# Patient Record
Sex: Female | Born: 1940 | Race: White | Hispanic: No | Marital: Married | Smoking: Current every day smoker
Health system: Southern US, Community
[De-identification: ages and names within clinical notes are randomized; demographics above are authoritative.]

## PROBLEM LIST (undated history)

## (undated) DIAGNOSIS — F329 Major depressive disorder, single episode, unspecified: Secondary | ICD-10-CM

## (undated) DIAGNOSIS — R053 Chronic cough: Secondary | ICD-10-CM

## (undated) DIAGNOSIS — R05 Cough: Secondary | ICD-10-CM

## (undated) DIAGNOSIS — K219 Gastro-esophageal reflux disease without esophagitis: Secondary | ICD-10-CM

## (undated) DIAGNOSIS — F419 Anxiety disorder, unspecified: Secondary | ICD-10-CM

## (undated) DIAGNOSIS — E7800 Pure hypercholesterolemia, unspecified: Secondary | ICD-10-CM

## (undated) DIAGNOSIS — I10 Essential (primary) hypertension: Secondary | ICD-10-CM

## (undated) DIAGNOSIS — F32A Depression, unspecified: Secondary | ICD-10-CM

## (undated) HISTORY — PX: COLONOSCOPY: SHX174

## (undated) HISTORY — PX: BREAST BIOPSY: SHX20

## (undated) HISTORY — PX: BREAST EXCISIONAL BIOPSY: SUR124

---

## 1971-10-12 HISTORY — PX: VARICOSE VEIN SURGERY: SHX832

## 2007-01-19 ENCOUNTER — Ambulatory Visit: Payer: Self-pay | Admitting: Family Medicine

## 2007-03-03 ENCOUNTER — Encounter: Payer: Self-pay | Admitting: Family Medicine

## 2007-03-12 ENCOUNTER — Encounter: Payer: Self-pay | Admitting: Family Medicine

## 2007-04-11 ENCOUNTER — Encounter: Payer: Self-pay | Admitting: Family Medicine

## 2007-05-12 ENCOUNTER — Encounter: Payer: Self-pay | Admitting: Family Medicine

## 2007-06-12 ENCOUNTER — Encounter: Payer: Self-pay | Admitting: Family Medicine

## 2008-04-08 ENCOUNTER — Ambulatory Visit: Payer: Self-pay | Admitting: Family Medicine

## 2008-09-23 ENCOUNTER — Ambulatory Visit: Payer: Self-pay | Admitting: Family Medicine

## 2009-08-25 ENCOUNTER — Ambulatory Visit: Payer: Self-pay

## 2009-10-11 HISTORY — PX: REPLACEMENT TOTAL KNEE: SUR1224

## 2010-07-24 ENCOUNTER — Encounter: Payer: Self-pay | Admitting: Family Medicine

## 2010-08-11 ENCOUNTER — Encounter: Payer: Self-pay | Admitting: Family Medicine

## 2011-02-03 ENCOUNTER — Ambulatory Visit: Payer: Self-pay | Admitting: Family Medicine

## 2011-06-01 ENCOUNTER — Ambulatory Visit: Payer: Self-pay | Admitting: Unknown Physician Specialty

## 2012-02-09 ENCOUNTER — Ambulatory Visit: Payer: Self-pay | Admitting: Family Medicine

## 2012-08-01 ENCOUNTER — Ambulatory Visit: Payer: Self-pay | Admitting: Family Medicine

## 2013-05-29 ENCOUNTER — Ambulatory Visit: Payer: Self-pay | Admitting: Family Medicine

## 2014-06-04 ENCOUNTER — Ambulatory Visit: Payer: Self-pay | Admitting: Family Medicine

## 2014-08-30 ENCOUNTER — Ambulatory Visit: Payer: Self-pay | Admitting: Unknown Physician Specialty

## 2014-12-04 ENCOUNTER — Ambulatory Visit: Payer: Self-pay | Admitting: Unknown Physician Specialty

## 2015-02-03 LAB — SURGICAL PATHOLOGY

## 2015-03-06 ENCOUNTER — Other Ambulatory Visit: Payer: Self-pay | Admitting: Family Medicine

## 2015-03-06 DIAGNOSIS — R059 Cough, unspecified: Secondary | ICD-10-CM

## 2015-03-06 DIAGNOSIS — R05 Cough: Secondary | ICD-10-CM

## 2015-03-18 ENCOUNTER — Ambulatory Visit: Payer: Self-pay

## 2016-02-12 ENCOUNTER — Other Ambulatory Visit: Payer: Self-pay | Admitting: Family Medicine

## 2016-02-12 DIAGNOSIS — Z1231 Encounter for screening mammogram for malignant neoplasm of breast: Secondary | ICD-10-CM

## 2016-02-23 ENCOUNTER — Ambulatory Visit
Admission: RE | Admit: 2016-02-23 | Discharge: 2016-02-23 | Disposition: A | Discharge status: Home / Self Care | Payer: Medicare Other | Source: Ambulatory Visit | Attending: Family Medicine | Admitting: Family Medicine

## 2016-02-23 ENCOUNTER — Other Ambulatory Visit: Payer: Self-pay | Admitting: Family Medicine

## 2016-02-23 DIAGNOSIS — Z1231 Encounter for screening mammogram for malignant neoplasm of breast: Secondary | ICD-10-CM | POA: Insufficient documentation

## 2016-05-31 ENCOUNTER — Encounter: Payer: Self-pay | Admitting: Emergency Medicine

## 2016-05-31 ENCOUNTER — Observation Stay
Admission: EM | Admit: 2016-05-31 | Discharge: 2016-06-01 | Disposition: A | Discharge status: Home / Self Care | Payer: Medicare Other | Attending: Internal Medicine | Admitting: Internal Medicine

## 2016-05-31 ENCOUNTER — Emergency Department: Payer: Medicare Other

## 2016-05-31 DIAGNOSIS — F17200 Nicotine dependence, unspecified, uncomplicated: Secondary | ICD-10-CM | POA: Diagnosis not present

## 2016-05-31 DIAGNOSIS — E785 Hyperlipidemia, unspecified: Secondary | ICD-10-CM | POA: Diagnosis not present

## 2016-05-31 DIAGNOSIS — I6523 Occlusion and stenosis of bilateral carotid arteries: Secondary | ICD-10-CM | POA: Diagnosis not present

## 2016-05-31 DIAGNOSIS — Z881 Allergy status to other antibiotic agents status: Secondary | ICD-10-CM | POA: Insufficient documentation

## 2016-05-31 DIAGNOSIS — I083 Combined rheumatic disorders of mitral, aortic and tricuspid valves: Secondary | ICD-10-CM | POA: Insufficient documentation

## 2016-05-31 DIAGNOSIS — Z716 Tobacco abuse counseling: Secondary | ICD-10-CM

## 2016-05-31 DIAGNOSIS — Z8041 Family history of malignant neoplasm of ovary: Secondary | ICD-10-CM | POA: Insufficient documentation

## 2016-05-31 DIAGNOSIS — I739 Peripheral vascular disease, unspecified: Secondary | ICD-10-CM | POA: Insufficient documentation

## 2016-05-31 DIAGNOSIS — I10 Essential (primary) hypertension: Secondary | ICD-10-CM | POA: Insufficient documentation

## 2016-05-31 DIAGNOSIS — G459 Transient cerebral ischemic attack, unspecified: Principal | ICD-10-CM | POA: Insufficient documentation

## 2016-05-31 DIAGNOSIS — Z800 Family history of malignant neoplasm of digestive organs: Secondary | ICD-10-CM | POA: Diagnosis not present

## 2016-05-31 DIAGNOSIS — I639 Cerebral infarction, unspecified: Secondary | ICD-10-CM | POA: Diagnosis present

## 2016-05-31 DIAGNOSIS — I371 Nonrheumatic pulmonary valve insufficiency: Secondary | ICD-10-CM | POA: Diagnosis not present

## 2016-05-31 DIAGNOSIS — Z79899 Other long term (current) drug therapy: Secondary | ICD-10-CM | POA: Insufficient documentation

## 2016-05-31 HISTORY — DX: Essential (primary) hypertension: I10

## 2016-05-31 LAB — COMPREHENSIVE METABOLIC PANEL
ALK PHOS: 70 U/L (ref 38–126)
ALT: 16 U/L (ref 14–54)
AST: 23 U/L (ref 15–41)
Albumin: 4.1 g/dL (ref 3.5–5.0)
Anion gap: 8 (ref 5–15)
BUN: 14 mg/dL (ref 6–20)
CHLORIDE: 105 mmol/L (ref 101–111)
CO2: 26 mmol/L (ref 22–32)
Calcium: 9.1 mg/dL (ref 8.9–10.3)
Creatinine, Ser: 0.73 mg/dL (ref 0.44–1.00)
GFR calc non Af Amer: >60 mL/min (ref >60)
Glucose, Bld: 98 mg/dL (ref 65–99)
POTASSIUM: 3.5 mmol/L (ref 3.5–5.1)
SODIUM: 139 mmol/L (ref 135–145)
Total Bilirubin: 0.6 mg/dL (ref 0.3–1.2)
Total Protein: 7.6 g/dL (ref 6.5–8.1)

## 2016-05-31 LAB — DIFFERENTIAL
Basophils Absolute: 0.1 10*3/uL (ref 0–0.1)
Basophils Relative: 1 %
Eosinophils Absolute: 0.1 10*3/uL (ref 0–0.7)
Eosinophils Relative: 2 %
LYMPHS PCT: 44 %
Lymphs Abs: 3.3 10*3/uL (ref 1.0–3.6)
MONO ABS: 0.6 10*3/uL (ref 0.2–0.9)
MONOS PCT: 8 %
NEUTROS ABS: 3.5 10*3/uL (ref 1.4–6.5)
Neutrophils Relative %: 45 %

## 2016-05-31 LAB — LIPID PANEL
CHOLESTEROL: 263 mg/dL — AB (ref 0–200)
HDL: 64 mg/dL (ref >40)
LDL CALC: 178 mg/dL — AB (ref 0–99)
TRIGLYCERIDES: 105 mg/dL (ref <150)
Total CHOL/HDL Ratio: 4.1 RATIO
VLDL: 21 mg/dL (ref 0–40)

## 2016-05-31 LAB — CBC
HEMATOCRIT: 43.6 % (ref 35.0–47.0)
Hemoglobin: 15.3 g/dL (ref 12.0–16.0)
MCH: 32.5 pg (ref 26.0–34.0)
MCHC: 35.2 g/dL (ref 32.0–36.0)
MCV: 92.5 fL (ref 80.0–100.0)
PLATELETS: 222 10*3/uL (ref 150–440)
RBC: 4.72 MIL/uL (ref 3.80–5.20)
RDW: 13.7 % (ref 11.5–14.5)
WBC: 7.7 10*3/uL (ref 3.6–11.0)

## 2016-05-31 LAB — TROPONIN I

## 2016-05-31 LAB — PROTIME-INR
INR: 1.01
Prothrombin Time: 13.3 seconds (ref 11.4–15.2)

## 2016-05-31 LAB — APTT: aPTT: 31 seconds (ref 24–36)

## 2016-05-31 MED ORDER — HEPARIN SODIUM (PORCINE) 5000 UNIT/ML IJ SOLN
5000.0000 [IU] | Freq: Three times a day (TID) | INTRAMUSCULAR | Status: DC
  Administered 2016-05-31 – 2016-06-01 (×2): 5000 [IU] via SUBCUTANEOUS
  Filled 2016-05-31 (×2): qty 1

## 2016-05-31 MED ORDER — AMLODIPINE BESYLATE 5 MG PO TABS
5.0000 mg | ORAL_TABLET | Freq: Every day | ORAL | Status: DC
  Administered 2016-06-01: 5 mg via ORAL
  Filled 2016-05-31: qty 1

## 2016-05-31 MED ORDER — SODIUM CHLORIDE 0.9% FLUSH
3.0000 mL | Freq: Two times a day (BID) | INTRAVENOUS | Status: DC
  Administered 2016-05-31 – 2016-06-01 (×2): 3 mL via INTRAVENOUS

## 2016-05-31 MED ORDER — PAROXETINE HCL 10 MG PO TABS
10.0000 mg | ORAL_TABLET | Freq: Every day | ORAL | Status: DC
  Administered 2016-06-01: 10:00:00 10 mg via ORAL
  Filled 2016-05-31: qty 1

## 2016-05-31 MED ORDER — ASPIRIN 81 MG PO CHEW
81.0000 mg | CHEWABLE_TABLET | Freq: Once | ORAL | Status: AC
  Administered 2016-05-31: 81 mg via ORAL
  Filled 2016-05-31: qty 1

## 2016-05-31 NOTE — ED Provider Notes (Signed)
[facility] Emergency Department Provider Note ____________________________________________   I have reviewed the triage vital signs and the triage nursing note.  HISTORY  Chief Complaint Aphasia   Historian Patient  HPI Joann Thomas is a 75 y.o. female who takes Norvasc and Paxil, states that she was speaking with her therapist/counselor between 12 and 1 PM, and at 1245 developed aphasia, she had trouble finding her words and then when the words came out they were not which she intended. The symptoms lasted about 8 minutes and then resolved. She had no headache. She had no vision changes. She had no coordination problems. She had no weakness or numbness. She went home and then came to the ED for further evaluation.  No prior history of heart attack, stroke, mini stroke, and does not take aspirin or any other blood thinner.  She does state that when she was speaking with her therapist she was fairly anxious at that time in terms of the conversation. However, she is never had an episode like this before under any sort of stress.    History reviewed. No pertinent past medical history.  There are no active problems to display for this patient.   Past Surgical History:  Procedure Laterality Date  . BREAST BIOPSY Left     Prior to Admission medications   Medication Sig Start Date End Date Taking? Authorizing Provider  amLODipine (NORVASC) 5 MG tablet Take 5 mg by mouth daily.   Yes Historical Provider, MD  Calcium Carbonate-Vit D-Min (CALCIUM 1200 PO) Take 1 tablet by mouth.   Yes Historical Provider, MD  Cholecalciferol (VITAMIN D PO) Take by mouth.   Yes Historical Provider, MD  Omega-3 Fatty Acids (FISH OIL PO) Take 1 capsule by mouth.   Yes Historical Provider, MD  PARoxetine (PAXIL) 10 MG tablet Take 10 mg by mouth daily.   Yes Historical Provider, MD    Allergies  Allergen Reactions  . Zithromax [Azithromycin] Rash    Rash on torso that looks  like a burn    Family History  Problem Relation Age of Onset  . Pancreatic cancer Father [id number]  . Ovarian cancer Paternal Aunt     Social History Social History  Substance Use Topics  . Smoking status: Never Smoker  . Smokeless tobacco: Never Used  . Alcohol use No    Review of Systems  Constitutional: Negative for recent illnesses. Eyes: Negative for visual changes. ENT: Negative for sore throat. Cardiovascular: Negative for chest pain. Respiratory: Negative for shortness of breath. Gastrointestinal: Negative for abdominal pain, vomiting and diarrhea. Genitourinary: Negative for dysuria. Musculoskeletal: Negative for back pain. Skin: Negative for rash. Neurological: Negative for headache. 10 point Review of Systems otherwise negative ____________________________________________   PHYSICAL EXAM:  VITAL SIGNS: ED Triage Vitals  Enc Vitals Group     BP 05/31/16 1423 (!) 169/81     Pulse Rate 05/31/16 1423 67     Resp 05/31/16 1423 18     Temp 05/31/16 1423 98.1 F (36.7 C)     Temp Source 05/31/16 1423 Oral     SpO2 05/31/16 1423 94 %     Weight 05/31/16 1423 140 lb (63.5 kg)     Height 05/31/16 1423 5' 5.5" (1.664 m)     Head Circumference --      Peak Flow --      Pain Score 05/31/16 1425 0     Pain Loc --      Pain Edu? --  Excl. in GC? --      Constitutional: Alert and oriented. Well appearing and in no distress. HEENT   Head: Normocephalic and atraumatic.      Eyes: Conjunctivae are normal. PERRL. Normal extraocular movements.      Ears:         Nose: No congestion/rhinnorhea.   Mouth/Throat: Mucous membranes are moist.   Neck: No stridor. Cardiovascular/Chest: Normal rate, regular rhythm.  No murmurs, rubs, or gallops. Respiratory: Normal respiratory effort without tachypnea nor retractions. Breath sounds are clear and equal bilaterally. No wheezes/rales/rhonchi. Gastrointestinal: Soft. No distention, no guarding, no rebound. Nontender.     Genitourinary/rectal:Deferred Musculoskeletal: Nontender with normal range of motion in all extremities. No joint effusions.  No lower extremity tenderness.  No edema. Neurologic:  Normal speech and language. No gross or focal neurologic deficits are appreciated. Skin:  Skin is warm, dry and intact. No rash noted. Psychiatric: Mood and affect are normal. Speech and behavior are normal. Patient exhibits appropriate insight and judgment.  ____________________________________________   EKG I, Governor Rooksebecca Kerisha Goughnour, MD, the attending physician have personally viewed and interpreted all ECGs.  61 bpm. Normal sinus rhythm. Narrow QRS. Normal axis. Normal ST and T wave. LVH. ____________________________________________  LABS (pertinent positives/negatives)  Labs Reviewed  PROTIME-INR  APTT  CBC  DIFFERENTIAL  COMPREHENSIVE METABOLIC PANEL  TROPONIN I    ____________________________________________  RADIOLOGY All Xrays were viewed by me. Imaging interpreted by Radiologist.  CT head without contrast:  IMPRESSION: No acute intracranial abnormality.  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 __________________________________________  PROCEDURES  Procedure(s) performed: None  Critical Care performed: None  ____________________________________________   ED COURSE / ASSESSMENT AND PLAN  Pertinent labs & imaging results that were available during my care of the patient were reviewed by me and considered in my medical decision making (see chart for details).   Ms. Viviano SimasMaurer presented with a resolved episode of expressive aphasia. Patient was sent for a CT immediately, but given resolved symptoms, patient was not made a code stroke.  Her blood pressure is running somewhat elevated around 180/100, and I will allow permissive hypertension in the setting of TIA.  She does have chronic hypertension and takes Norvasc.  She was given baby aspirin, as she does not  take any blood thinner at home normally.  I spoke with neurology on call Dr. Madalyn RobZeylickman, recommends inpatient workup for TIA including MRI and MRA.   CONSULTATIONS:  Neurology by phone. Face-to-face with hospitalist for admission.   Patient / Family / Caregiver informed of clinical course, medical decision-making process, and agree with plan.    ___________________________________________   FINAL CLINICAL IMPRESSION(S) / ED DIAGNOSES   Final diagnoses:  Transient cerebral ischemia, unspecified transient cerebral ischemia type              Note: This dictation was prepared with Dragon dictation. Any transcriptional errors that result from this process are unintentional    Governor Rooksebecca Arinze Rivadeneira, MD 05/31/16 [phone]

## 2016-05-31 NOTE — H&P (Signed)
[employer] [facility] [place] [facility]   PATIENT NAME: Joann Thomas    MR#:  [id number]  DATE OF BIRTH:  Dec 19, 1940  DATE OF ADMISSION:  05/31/2016  PRIMARY CARE PHYSICIAN: Dorothey Baseman, MD   REQUESTING/REFERRING PHYSICIAN: Lord  CHIEF COMPLAINT:   Chief Complaint  Patient presents with  . Aphasia    HISTORY OF PRESENT ILLNESS: Joann Thomas  is a 75 y.o. female with a known history of Hypertension, was talking to 104 counseled her today and noted that she has word finding difficulty. This lasted for 8 minutes, and was not associated with any other symptoms of numbness and weakness and confusion headache or visual changes. She denies any similar episodes in the past. Completely recovered and doing fine currently in ER. Concerned with her symptoms CT scan of the head was done in ER which was negative, ER physician spoke to on call neurologist he suggested to admit for stroke workup.  PAST MEDICAL HISTORY:   Past Medical History:  Diagnosis Date  . Hypertension     PAST SURGICAL HISTORY:  Past Surgical History:  Procedure Laterality Date  . BREAST BIOPSY Left     SOCIAL HISTORY:  Social History  Substance Use Topics  . Smoking status: Current Every Day Smoker    Packs/day: 0.50  . Smokeless tobacco: Never Used  . Alcohol use No    FAMILY HISTORY:  Family History  Problem Relation Age of Onset  . Pancreatic cancer Father 33  . Ovarian cancer Paternal Aunt     DRUG ALLERGIES:  Allergies  Allergen Reactions  . Zithromax [Azithromycin] Rash    Rash on torso that looks like a burn    REVIEW OF SYSTEMS:   CONSTITUTIONAL: No fever, fatigue or weakness.  EYES: No blurred or double vision.  EARS, NOSE, AND THROAT: No tinnitus or ear pain. Some speech problem which resolved completely. RESPIRATORY: No cough, shortness of breath, wheezing or hemoptysis.  CARDIOVASCULAR: No chest pain, orthopnea, edema.  GASTROINTESTINAL: No nausea, vomiting, diarrhea  or abdominal pain.  GENITOURINARY: No dysuria, hematuria.  ENDOCRINE: No polyuria, nocturia,  HEMATOLOGY: No anemia, easy bruising or bleeding SKIN: No rash or lesion. MUSCULOSKELETAL: No joint pain or arthritis.   NEUROLOGIC: No tingling, numbness, weakness.  PSYCHIATRY: No anxiety or depression.   MEDICATIONS AT HOME:  Prior to Admission medications   Medication Sig Start Date End Date Taking? Authorizing Provider  amLODipine (NORVASC) 5 MG tablet Take 5 mg by mouth daily.   Yes Historical Provider, MD  Calcium Carbonate-Vit D-Min (CALCIUM 1200 PO) Take 1 tablet by mouth.   Yes Historical Provider, MD  Cholecalciferol (VITAMIN D PO) Take by mouth.   Yes Historical Provider, MD  Omega-3 Fatty Acids (FISH OIL PO) Take 1 capsule by mouth.   Yes Historical Provider, MD  PARoxetine (PAXIL) 10 MG tablet Take 10 mg by mouth daily.   Yes Historical Provider, MD      PHYSICAL EXAMINATION:   VITAL SIGNS: Blood pressure (!) 194/91, pulse (!) 59, temperature 98.1 F (36.7 C), temperature source Oral, resp. rate 16, height 5' 5.5" (1.664 m), weight 63.5 kg (140 lb), SpO2 97 %.  GENERAL:  75 y.o.-year-old patient lying in the bed with no acute distress.  EYES: Pupils equal, round, reactive to light and accommodation. No scleral icterus. Extraocular muscles intact.  HEENT: Head atraumatic, normocephalic. Oropharynx and nasopharynx clear.  NECK:  Supple, no jugular venous distention. No thyroid enlargement, no tenderness.  LUNGS: Normal breath sounds bilaterally,  no wheezing, rales,rhonchi or crepitation. No use of accessory muscles of respiration.  CARDIOVASCULAR: S1, S2 normal. No murmurs, rubs, or gallops.  ABDOMEN: Soft, nontender, nondistended. Bowel sounds present. No organomegaly or mass.  EXTREMITIES: No pedal edema, cyanosis, or clubbing.  NEUROLOGIC: Cranial nerves II through XII are intact. Muscle strength 5/5 in all extremities. Sensation intact. Gait not checked.  PSYCHIATRIC: The  patient is alert and oriented x 3.  SKIN: No obvious rash, lesion, or ulcer.   LABORATORY PANEL:   CBC  Recent Labs Lab 05/31/16 1456  WBC 7.7  HGB 15.3  HCT 43.6  PLT 222  MCV 92.5  MCH 32.5  MCHC 35.2  RDW 13.7  LYMPHSABS 3.3  MONOABS 0.6  EOSABS 0.1  BASOSABS 0.1   ------------------------------------------------------------------------------------------------------------------  Chemistries   Recent Labs Lab 05/31/16 1456  NA 139  K 3.5  CL 105  CO2 26  GLUCOSE 98  BUN 14  CREATININE 0.73  CALCIUM 9.1  AST 23  ALT 16  ALKPHOS 70  BILITOT 0.6   ------------------------------------------------------------------------------------------------------------------ estimated creatinine clearance is 55 mL/min (by C-G formula based on SCr of 0.8 mg/dL). ------------------------------------------------------------------------------------------------------------------ No results for input(s): TSH, T4TOTAL, T3FREE, THYROIDAB in the last 72 hours.  Invalid input(s): FREET3   Coagulation profile  Recent Labs Lab 05/31/16 1456  INR 1.01   ------------------------------------------------------------------------------------------------------------------- No results for input(s): DDIMER in the last 72 hours. -------------------------------------------------------------------------------------------------------------------  Cardiac Enzymes  Recent Labs Lab 05/31/16 1456  TROPONINI <0.03   ------------------------------------------------------------------------------------------------------------------ Invalid input(s): POCBNP  ---------------------------------------------------------------------------------------------------------------  Urinalysis No results found for: COLORURINE, APPEARANCEUR, LABSPEC, PHURINE, GLUCOSEU, HGBUR, BILIRUBINUR, KETONESUR, PROTEINUR, UROBILINOGEN, NITRITE, LEUKOCYTESUR   RADIOLOGY: Ct Head Wo Contrast  Result Date:  05/31/2016 CLINICAL DATA:  Difficulty finding words, starting at 12:45 p.m. EXAM: CT HEAD WITHOUT CONTRAST TECHNIQUE: Contiguous axial images were obtained from the base of the skull through the vertex without intravenous contrast. COMPARISON:  None. FINDINGS: Brain: No evidence of acute infarction, hemorrhage, hydrocephalus, extra-axial collection or mass lesion/mass effect. There is moderate brain parenchymal volume loss and evidence of deep white matter microangiopathy. Vascular: No hyperdense vessel or unexpected calcification. Skull: No evidence of fractures. Sinuses/Orbits: No acute finding. Other: None. IMPRESSION: No acute intracranial abnormality. 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 Electronically Signed   By: Ted Mcalpineobrinka  Dimitrova M.D.   On: 05/31/2016 14:59    EKG: Orders placed or performed during the hospital encounter of 05/31/16  . ED EKG  . ED EKG  . EKG 12-Lead  . EKG 12-Lead    IMPRESSION AND PLAN:  * Speech problem- likely TIA   Cardiac monitoring, MRI of the brain, echocardiogram, carotid Doppler study.   Aspirin, may need statins.     Check lipid panel and hemoglobin A1c.  * Hypertension   Currently we will continue her amlodipine 5 mg and I'll allow permissive hypertension.  * Active smoker    Counseled to quit smoking and offered nicotine patch to her, time spent 4 minutes.   All the records are reviewed and case discussed with ED provider. Management plans discussed with the patient, family and they are in agreement.  CODE STATUS: full code  Code Status History    This patient does not have a recorded code status. Please follow your organizational policy for patients in this situation.       TOTAL TIME TAKING CARE OF THIS PATIENT: 50es.    Altamese DillingVACHHANI, Sylina Henion M.D on 05/31/2016   Between 7am to 6pm - Pager - [phone]  After  6pm go to [email or web address] - password [email or web address]  [facility]  Office   [phone]  CC: Primary care physician; Dorothey BasemanAVID BRONSTEIN, MD   Note: This dictation was prepared with Dragon dictation along with smaller phrase technology. Any transcriptional errors that result from this process are unintentional.

## 2016-05-31 NOTE — ED Triage Notes (Signed)
Patient presents to the ED with an episode of difficulty "finding words" around 12:45.  Patient has equal grip strength and smile is symmetrical.  Patient is speaking easily and fluidly at this time.  Patient reports feeling, "a little foggy".  Patient denies dizziness or blurred vision.  Patient reports hypertension at home.  These findings were reported to Dr. Don PerkingVeronese at this time.

## 2016-05-31 NOTE — ED Notes (Signed)
Patient explained that she was having a conversation with a friend and had an "8 minute" period where she "could not find the words she was trying to say". Pt reports that her friend told her she did not have any facial droop or slurred speech. Patient denies visual changes or weakness. Patient's speech is clear and she does not appear to have trouble choosing her words. Pt transported to CT approx 10 minutes ago.

## 2016-05-31 NOTE — ED Notes (Signed)
Helped pt to bathroom. 

## 2016-06-01 ENCOUNTER — Observation Stay: Payer: Medicare Other

## 2016-06-01 ENCOUNTER — Observation Stay (HOSPITAL_BASED_OUTPATIENT_CLINIC_OR_DEPARTMENT_OTHER)
Admit: 2016-06-01 | Discharge: 2016-06-01 | Disposition: A | Discharge status: Home / Self Care | Payer: Medicare Other | Attending: Internal Medicine | Admitting: Internal Medicine

## 2016-06-01 DIAGNOSIS — Z716 Tobacco abuse counseling: Secondary | ICD-10-CM

## 2016-06-01 DIAGNOSIS — G459 Transient cerebral ischemic attack, unspecified: Secondary | ICD-10-CM | POA: Diagnosis not present

## 2016-06-01 DIAGNOSIS — I10 Essential (primary) hypertension: Secondary | ICD-10-CM

## 2016-06-01 DIAGNOSIS — I6350 Cerebral infarction due to unspecified occlusion or stenosis of unspecified cerebral artery: Secondary | ICD-10-CM

## 2016-06-01 DIAGNOSIS — E785 Hyperlipidemia, unspecified: Secondary | ICD-10-CM

## 2016-06-01 LAB — ECHOCARDIOGRAM COMPLETE
HEIGHTINCHES: 65.5 in
WEIGHTICAEL: 2240 [oz_av]

## 2016-06-01 LAB — CBC
HEMATOCRIT: 42.2 % (ref 35.0–47.0)
HEMOGLOBIN: 14.7 g/dL (ref 12.0–16.0)
MCH: 32.1 pg (ref 26.0–34.0)
MCHC: 34.7 g/dL (ref 32.0–36.0)
MCV: 92.5 fL (ref 80.0–100.0)
Platelets: 206 10*3/uL (ref 150–440)
RBC: 4.56 MIL/uL (ref 3.80–5.20)
RDW: 14 % (ref 11.5–14.5)
WBC: 5.7 10*3/uL (ref 3.6–11.0)

## 2016-06-01 LAB — BASIC METABOLIC PANEL
Anion gap: 8 (ref 5–15)
BUN: 16 mg/dL (ref 6–20)
CALCIUM: 8.5 mg/dL — AB (ref 8.9–10.3)
CHLORIDE: 108 mmol/L (ref 101–111)
CO2: 26 mmol/L (ref 22–32)
Creatinine, Ser: 0.7 mg/dL (ref 0.44–1.00)
GFR calc non Af Amer: >60 mL/min (ref >60)
GLUCOSE: 105 mg/dL — AB (ref 65–99)
POTASSIUM: 3.6 mmol/L (ref 3.5–5.1)
Sodium: 142 mmol/L (ref 135–145)

## 2016-06-01 LAB — HEMOGLOBIN A1C: Hgb A1c MFr Bld: 5.6 % (ref 4.0–6.0)

## 2016-06-01 MED ORDER — NICOTINE 14 MG/24HR TD PT24: 14.0000 mg | MEDICATED_PATCH | Freq: Every day | TRANSDERMAL | 0 refills | Status: DC

## 2016-06-01 MED ORDER — ASPIRIN 81 MG PO TBEC: 81.0000 mg | DELAYED_RELEASE_TABLET | Freq: Every day | ORAL | 5 refills | Status: AC

## 2016-06-01 MED ORDER — ASPIRIN EC 81 MG PO TBEC
81.0000 mg | DELAYED_RELEASE_TABLET | Freq: Every day | ORAL | Status: DC
  Administered 2016-06-01: 81 mg via ORAL
  Filled 2016-06-01: qty 1

## 2016-06-01 MED ORDER — NICOTINE 14 MG/24HR TD PT24
14.0000 mg | MEDICATED_PATCH | Freq: Every day | TRANSDERMAL | Status: DC
  Administered 2016-06-01: 14 mg via TRANSDERMAL
  Filled 2016-06-01: qty 1

## 2016-06-01 MED ORDER — ATORVASTATIN CALCIUM 20 MG PO TABS: 40.0000 mg | ORAL_TABLET | Freq: Every day | ORAL | Status: DC

## 2016-06-01 MED ORDER — ATORVASTATIN CALCIUM 40 MG PO TABS
40.0000 mg | ORAL_TABLET | Freq: Every day | ORAL | 5 refills | Status: AC
Start: 2016-06-01 — End: ?

## 2016-06-01 NOTE — Discharge Summary (Signed)
[facility] [place] at [facility]   PATIENT NAME: Joann DryLynne Thomas    MR#:  [id number]  DATE OF BIRTH:  10/02/1941  DATE OF ADMISSION:  05/31/2016 ADMITTING PHYSICIAN: Altamese DillingVaibhavkumar Vachhani, MD  DATE OF DISCHARGE: 06/01/2016  2:10 PM  PRIMARY CARE PHYSICIAN: Dorothey BasemanAVID BRONSTEIN, MD     ADMISSION DIAGNOSIS:  CVA (cerebral infarction) [I63.9] Transient cerebral ischemia, unspecified transient cerebral ischemia type [G45.9]  DISCHARGE DIAGNOSIS:  Principal Problem:   TIA (transient ischemic attack) Active Problems:   Hyperlipidemia   Essential hypertension   Tobacco abuse counseling   SECONDARY DIAGNOSIS:   Past Medical History:  Diagnosis Date  . Hypertension     .pro HOSPITAL COURSE:   Patient is 75 year old Caucasian female with medical history significant for history of essential hypertension who presents to the hospital with complaints of difficulty finding words, lasting for approximately 8 minutes, was no associated upper or lower extremity weakness and numbness. On arrival to the hospital, CT scan of head was done, unremarkable. Patient was admitted to the hospital with diagnosis of TIA. She feels good today, her speech has recovered. 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 Echocardiogram showed normal ejection fraction, mild mitral regurgitation, elevated pulmonary arterial pressure to 42 mmHg. Patient's lipid panel was checked, LDL was found to be highly elevated at 178, total cholesterol of 263, HDL 64, triglycerides 105. Patient was initiated on Lipitor. 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 Hyperlipidemia, as above. Continue Lipitor #3. essential hypertension, patient's continue outpatient medications., Blood pressure was elevated while she was in the hospital at around 140, Norvasc, may need to be advanced as outpatient #4. Tobacco abuse counseling, discussed this patient for 4 minutes, nicotine replacement therapy was initiated  STABLE  CONSULTS OBTAINED:  Treatment Team:  Pauletta BrownsYuriy Zeylikman, MD  DRUG ALLERGIES:   Allergies  Allergen Reactions  . Zithromax [Azithromycin] Rash    Rash on torso that looks like a burn    DISCHARGE MEDICATIONS:   Discharge Medication List as of 06/01/2016  1:34 PM    START taking these medications   Details  aspirin EC 81 MG EC tablet Take 1 tablet (81 mg total) by mouth daily., Starting Tue 06/01/2016, Normal    atorvastatin (LIPITOR) 40 MG tablet Take 1 tablet (40 mg total) by mouth daily at 6 PM., Starting Tue 06/01/2016, Normal    nicotine (NICODERM CQ - DOSED IN MG/24 HOURS) 14 mg/24hr patch Place 1 patch (14 mg total) onto the skin daily., Starting Tue 06/01/2016, Normal      CONTINUE these medications which have NOT CHANGED   Details  amLODipine (NORVASC) 5 MG tablet Take 5 mg by mouth daily., Historical Med    Calcium Carbonate-Vit D-Min (CALCIUM 1200 PO) Take 1 tablet by mouth., Historical Med    Cholecalciferol (VITAMIN D PO) Take by mouth., Historical Med    Omega-3 Fatty Acids (FISH OIL PO) Take 1 capsule by mouth., Historical Med    PARoxetine (PAXIL) 10  MG tablet Take 10 mg by mouth daily., Historical Med         Patient is to follow-up with primary care physician as outpatient  the patient is to follow-up with primary care physician as  outpatient  If you experience worsening of your admission symptoms, develop shortness of breath, life threatening emergency, suicidal or homicidal thoughts you must seek medical attention immediately by calling 911 or calling your MD immediately  if symptoms less severe.  You Must read complete instructions/literature along with all the possible adverse reactions/side effects for all the Medicines you take and that have been prescribed to you. Take any new Medicines after you have completely understood and accept all the possible adverse reactions/side effects.   Please note  You were cared for by a hospitalist during your hospital stay. If you have any questions about your discharge medications or the care you received while you were in the hospital after you are discharged, you can call the unit and asked to speak with the hospitalist on call if the hospitalist that took care of you is not available. Once you are discharged, your primary care physician will handle any further medical issues. Please note that NO REFILLS for any discharge medications will be authorized once you are discharged, as it is imperative that you return to your primary care physician (or establish a relationship with a primary care physician if you do not have one) for your aftercare needs so that they can reassess your need for medications and monitor your lab values.    Today   CHIEF COMPLAINT:   Chief Complaint  Patient presents with  . Aphasia    HISTORY OF PRESENT ILLNESS:  Joann Thomas  is a 75 y.o. female with a known history of essential hypertension who presents to the hospital with complaints of difficulty finding words, lasting for approximately 8 minutes, was no associated upper or lower extremity weakness and numbness. On arrival to the hospital, CT scan of head was done, unremarkable. Patient was admitted to the hospital with diagnosis of TIA. She feels good today, her speech has recovered. 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 Echocardiogram showed normal ejection fraction, mild mitral regurgitation, elevated pulmonary arterial pressure to 42 mmHg. Patient's lipid panel was checked, LDL was found to be highly elevated at 178, total cholesterol of 263, HDL 64, triglycerides 105. Patient was initiated on Lipitor. 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 Hyperlipidemia, as above. Continue Lipitor #3. essential hypertension, patient's continue outpatient medications., Blood pressure was elevated while she was in the hospital at around 140, Norvasc, may need to be advanced as outpatient #4. Tobacco abuse counseling, discussed this patient for 4 minutes, nicotine replacement therapy was initiated    VITAL SIGNS:  Blood pressure (!) 149/85, pulse 74, temperature 98.1 F (36.7 C), temperature source Oral, resp. rate 20, height 5' 5.5" (1.664 m), weight 63.5 kg (140 lb), SpO2 94 %.  I/O:   Intake/Output Summary (Last 24 hours)  at 06/01/16 1424 Last data filed at 05/31/16 2315  Gross per 24 hour  Intake              240 ml  Output                0 ml  Net              240 ml    PHYSICAL EXAMINATION:  GENERAL:  75 y.o.-year-old patient lying in the bed with no acute distress.  EYES: Pupils equal, round, reactive to light and accommodation. No scleral icterus.  Extraocular muscles intact.  HEENT: Head atraumatic, normocephalic. Oropharynx and nasopharynx clear.  NECK:  Supple, no jugular venous distention. No thyroid enlargement, no tenderness.  LUNGS: Normal breath sounds bilaterally, no wheezing, rales,rhonchi or crepitation. No use of accessory muscles of respiration.  CARDIOVASCULAR: S1, S2 normal. No murmurs, rubs, or gallops.  ABDOMEN: Soft, non-tender, non-distended. Bowel sounds present. No organomegaly or mass.  EXTREMITIES: No pedal edema, cyanosis, or clubbing.  NEUROLOGIC: Cranial nerves II through XII are intact. Muscle strength 5/5 in all extremities. Sensation intact. Gait not checked.  PSYCHIATRIC: The patient is alert and oriented x 3.  SKIN: No obvious rash, lesion, or ulcer.   DATA REVIEW:   CBC  Recent Labs Lab 06/01/16 0457  WBC 5.7  HGB 14.7  HCT 42.2  PLT 206    Chemistries   Recent Labs Lab 05/31/16 1456 06/01/16 0457  NA 139 142  K 3.5 3.6  CL 105 108  CO2 26 26  GLUCOSE 98 105*  BUN 14 16  CREATININE 0.73 0.70  CALCIUM 9.1 8.5*  AST 23  --   ALT 16  --   ALKPHOS 70  --   BILITOT 0.6  --     Cardiac Enzymes  Recent Labs Lab 05/31/16 1456  TROPONINI <0.03    Microbiology Results  No results found for this or any previous visit.  RADIOLOGY:  Ct Head Wo Contrast  Result Date: 05/31/2016 CLINICAL DATA:  Difficulty finding words, starting at 12:45 p.m. EXAM: CT HEAD WITHOUT CONTRAST TECHNIQUE: Contiguous axial images were obtained from the base of the skull through the vertex without intravenous contrast. COMPARISON:  None. FINDINGS: Brain: No evidence of acute infarction, hemorrhage, hydrocephalus, extra-axial collection or mass lesion/mass effect. There is moderate brain parenchymal volume loss and evidence of deep white matter microangiopathy. Vascular: No hyperdense vessel or unexpected calcification. Skull: No evidence of fractures. Sinuses/Orbits: No acute finding. Other: None. IMPRESSION:  No acute intracranial abnormality. 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 Electronically Signed   By: Ted Mcalpineobrinka  Dimitrova M.D.   On: 05/31/2016 14:59   Mr Brain Wo Contrast  Result Date: 06/01/2016 CLINICAL DATA:  [age] year old female with episode of abnormal speech today. Initial encounter. Hypertension. EXAM: MRI HEAD WITHOUT CONTRAST TECHNIQUE: Multiplanar, multiecho pulse sequences of the brain and surrounding structures were obtained without intravenous contrast. COMPARISON:  Head CT without contrast 05/31/2016. FINDINGS: Major intracranial vascular flow voids are preserved, dominant distal left vertebral artery. No restricted diffusion or evidence of acute infarction. Cerebral volume is within normal limits for age. No midline shift, mass effect, evidence of mass lesion, ventriculomegaly, extra-axial collection or acute intracranial hemorrhage. Cervicomedullary junction and pituitary are within normal limits. Negative for age visible cervical spine. Patchy periventricular nonspecific white matter T2 and FLAIR hyperintensity. Extent is mild to moderate for age. No cortical encephalomalacia or chronic cerebral blood products identified. Deep gray matter nuclei, brainstem, and cerebellum are  normal. Visible internal auditory structures appear normal. Visualized paranasal sinuses and mastoids are stable and well pneumatized. Visualized orbit soft tissues are within normal limits. Negative scalp soft tissues. Visualized bone marrow signal is within normal limits. IMPRESSION: 1.  No acute intracranial abnormality. 2. Mild to moderate for age nonspecific cerebral white matter signal changes, most commonly due to chronic small vessel disease. Electronically Signed   By: Odessa Fleming M.D.   On: 06/01/2016 09:50   US Carotid Bilateral  Result Date: 06/01/2016 CLINICAL DATA:  CVA. EXAM: BILATERAL CAROTID DUPLEX ULTRASOUND TECHNIQUE: Wallace Cullens scale imaging, color Doppler and duplex ultrasound  were performed of bilateral carotid and vertebral arteries in the neck. COMPARISON:  CT 05/31/2016 . FINDINGS: Criteria: Quantification of carotid stenosis is based on velocity parameters that correlate the residual internal carotid diameter with NASCET-based stenosis levels, using the diameter of the distal internal carotid lumen as the denominator for stenosis measurement. The following velocity measurements were obtained: RIGHT ICA:  102/30 cm/sec CCA:  75/15 cm/sec SYSTOLIC ICA/CCA RATIO:  1.4 DIASTOLIC ICA/CCA RATIO:  1.9 ECA:  143 cm/sec LEFT ICA:  80/23 cm/sec CCA:  84/17 cm/sec SYSTOLIC ICA/CCA RATIO:  1.1 DIASTOLIC ICA/CCA RATIO:  1.3 ECA:  72 cm/sec RIGHT CAROTID ARTERY: Moderate right carotid bifurcation atherosclerotic vascular disease. Degree of stenosis less 50%. RIGHT VERTEBRAL ARTERY:  Patent with antegrade flow . LEFT CAROTID ARTERY: Moderate left carotid bifurcation atherosclerotic vascular disease. Degree of stenosis less than 50% . LEFT VERTEBRAL ARTERY:  Patent with antegrade flow. IMPRESSION: 1. 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 Degree of stenosis less than 50% bilaterally. 2. Vertebral arteries are patent antegrade flow . Electronically Signed   By: Maisie Fus  Register   On: 06/01/2016 09:02    EKG:   Orders placed or performed during the hospital encounter of 05/31/16  . ED EKG  . ED EKG  . EKG 12-Lead  . EKG 12-Lead      Management plans discussed with the patient, family and they are in agreement.  CODE STATUS:     Code Status Orders        Start     Ordered   05/31/16 1945  Full code  Continuous     05/31/16 1944    Code Status History    Date Active Date Inactive Code Status Order ID Comments User Context   05/31/2016  7:44 PM 06/01/2016  7:20 AM Full Code [id number]  Altamese Dilling, MD ED      TOTAL TIME TAKING CARE OF THIS PATIENT:35 minutes.   discussed with neurologist   Ryna Beckstrom M.D on 06/01/2016 at 2:24  PM  Between 7am to 6pm - Pager - [phone]  After 6pm go to [email or web address] - password [email or web address] [facility]  [place] [facility]  Office  [phone]  CC: Primary care physician; Dorothey Baseman, MD

## 2016-06-01 NOTE — Progress Notes (Signed)
Patient discharged home per MD order. [place] booklet given to patient. All discharge instructions given and all questions answered.

## 2016-06-01 NOTE — Care Management (Signed)
Admitted to [facility] under observation status with the diagnosis of TIA. Lives with husband, Marquette OldYoram [phone]). Last seen Dr. Terance HartBronstein for her yearly physical in the Spring. Takes care of all basic and instrumental activities of daily living herself, drives. No falls. Uses no aids for ambulation. Good appetite. Gwenette GreetBrenda S Mase Dhondt RN MSN CCM Care Management [phone]

## 2016-06-01 NOTE — Progress Notes (Signed)
*  PRELIMINARY RESULTS* Echocardiogram 2D Echocardiogram has been performed.  Cristela BlueHege, Nollie Shiflett 06/01/2016, 8:15 AM

## 2016-06-01 NOTE — Care Management Obs Status (Signed)
MEDICARE OBSERVATION STATUS NOTIFICATION   Patient Details  Name: Leafy KindleLynne C Din MRN: [id number] Date of Birth: Feb 01, 1941   Medicare Observation Status Notification Given:  Yes    Gwenette GreetBrenda S Jasai Sorg, RN 06/01/2016, 11:34 AM

## 2016-06-01 NOTE — Progress Notes (Signed)
Patient cleared for discharge per Dr Winona LegatoVaickute

## 2017-04-04 ENCOUNTER — Encounter: Payer: Self-pay | Admitting: *Deleted

## 2017-04-06 NOTE — Discharge Instructions (Signed)

## 2017-04-07 ENCOUNTER — Other Ambulatory Visit: Payer: Self-pay | Admitting: Family Medicine

## 2017-04-07 DIAGNOSIS — Z1231 Encounter for screening mammogram for malignant neoplasm of breast: Secondary | ICD-10-CM

## 2017-04-11 ENCOUNTER — Ambulatory Visit: Payer: Medicare Other | Admitting: Anesthesiology

## 2017-04-11 ENCOUNTER — Encounter
Admission: RE | Disposition: A | Discharge status: Home / Self Care | Payer: Self-pay | Source: Ambulatory Visit | Attending: Ophthalmology

## 2017-04-11 ENCOUNTER — Ambulatory Visit
Admission: RE | Admit: 2017-04-11 | Discharge: 2017-04-11 | Disposition: A | Discharge status: Home / Self Care | Payer: Medicare Other | Source: Ambulatory Visit | Attending: Ophthalmology | Admitting: Ophthalmology

## 2017-04-11 DIAGNOSIS — H2511 Age-related nuclear cataract, right eye: Secondary | ICD-10-CM | POA: Diagnosis present

## 2017-04-11 DIAGNOSIS — I10 Essential (primary) hypertension: Secondary | ICD-10-CM | POA: Diagnosis not present

## 2017-04-11 DIAGNOSIS — Z881 Allergy status to other antibiotic agents status: Secondary | ICD-10-CM | POA: Diagnosis not present

## 2017-04-11 DIAGNOSIS — F419 Anxiety disorder, unspecified: Secondary | ICD-10-CM | POA: Diagnosis not present

## 2017-04-11 DIAGNOSIS — Z87891 Personal history of nicotine dependence: Secondary | ICD-10-CM | POA: Insufficient documentation

## 2017-04-11 DIAGNOSIS — Z96659 Presence of unspecified artificial knee joint: Secondary | ICD-10-CM | POA: Insufficient documentation

## 2017-04-11 DIAGNOSIS — Z8673 Personal history of transient ischemic attack (TIA), and cerebral infarction without residual deficits: Secondary | ICD-10-CM | POA: Diagnosis not present

## 2017-04-11 DIAGNOSIS — E7800 Pure hypercholesterolemia, unspecified: Secondary | ICD-10-CM | POA: Insufficient documentation

## 2017-04-11 DIAGNOSIS — K219 Gastro-esophageal reflux disease without esophagitis: Secondary | ICD-10-CM | POA: Diagnosis not present

## 2017-04-11 HISTORY — PX: CATARACT EXTRACTION W/PHACO: SHX586

## 2017-04-11 HISTORY — DX: Gastro-esophageal reflux disease without esophagitis: K21.9

## 2017-04-11 SURGERY — PHACOEMULSIFICATION, CATARACT, WITH IOL INSERTION
Anesthesia: Monitor Anesthesia Care | Site: Eye | Laterality: Right | Wound class: Clean

## 2017-04-11 MED ORDER — BRIMONIDINE TARTRATE-TIMOLOL 0.2-0.5 % OP SOLN
OPHTHALMIC | Status: DC | PRN
  Administered 2017-04-11: 1 [drp] via OPHTHALMIC

## 2017-04-11 MED ORDER — LACTATED RINGERS IV SOLN: INTRAVENOUS | Status: DC

## 2017-04-11 MED ORDER — CEFUROXIME OPHTHALMIC INJECTION 1 MG/0.1 ML
INJECTION | OPHTHALMIC | Status: DC | PRN
  Administered 2017-04-11: 0.1 mL via INTRACAMERAL

## 2017-04-11 MED ORDER — FENTANYL CITRATE (PF) 100 MCG/2ML IJ SOLN
INTRAMUSCULAR | Status: DC | PRN
  Administered 2017-04-11: 50 ug via INTRAVENOUS

## 2017-04-11 MED ORDER — EPINEPHRINE PF 1 MG/ML IJ SOLN
INTRAOCULAR | Status: DC | PRN
  Administered 2017-04-11: 75 mL via OPHTHALMIC

## 2017-04-11 MED ORDER — MIDAZOLAM HCL 2 MG/2ML IJ SOLN
INTRAMUSCULAR | Status: DC | PRN
  Administered 2017-04-11: 2 mg via INTRAVENOUS

## 2017-04-11 MED ORDER — ARMC OPHTHALMIC DILATING DROPS
1.0000 "application " | OPHTHALMIC | Status: DC | PRN
  Administered 2017-04-11 (×3): 1 via OPHTHALMIC

## 2017-04-11 MED ORDER — NA HYALUR & NA CHOND-NA HYALUR 0.4-0.35 ML IO KIT
PACK | INTRAOCULAR | Status: DC | PRN
  Administered 2017-04-11: 1 mL via INTRAOCULAR

## 2017-04-11 MED ORDER — MOXIFLOXACIN HCL 0.5 % OP SOLN
1.0000 [drp] | OPHTHALMIC | Status: DC | PRN
  Administered 2017-04-11 (×3): 1 [drp] via OPHTHALMIC

## 2017-04-11 MED ORDER — LIDOCAINE HCL (PF) 4 % IJ SOLN
INTRAOCULAR | Status: DC | PRN
  Administered 2017-04-11: 1 mL via OPHTHALMIC

## 2017-04-11 SURGICAL SUPPLY — 25 items
CANNULA ANT/CHMB 27GA (MISCELLANEOUS) ×3 IMPLANT
CARTRIDGE ABBOTT (MISCELLANEOUS) IMPLANT
GLOVE SURG LX 7.5 STRW (GLOVE) ×4
GLOVE SURG LX STRL 7.5 STRW (GLOVE) ×2 IMPLANT
GLOVE SURG TRIUMPH 8.0 PF LTX (GLOVE) ×3 IMPLANT
GOWN STRL REUS W/ TWL LRG LVL3 (GOWN DISPOSABLE) ×2 IMPLANT
GOWN STRL REUS W/TWL LRG LVL3 (GOWN DISPOSABLE) ×4
MARKER SKIN DUAL TIP RULER LAB (MISCELLANEOUS) ×3 IMPLANT
NDL RETROBULBAR .5 NSTRL (NEEDLE) IMPLANT
NEEDLE FILTER BLUNT 18X 1/2SAF (NEEDLE) ×2
NEEDLE FILTER BLUNT 18X1 1/2 (NEEDLE) ×1 IMPLANT
PACK CATARACT BRASINGTON (MISCELLANEOUS) ×3 IMPLANT
PACK EYE AFTER SURG (MISCELLANEOUS) ×3 IMPLANT
PACK OPTHALMIC (MISCELLANEOUS) ×3 IMPLANT
RING MALYGIN 7.0 (MISCELLANEOUS) IMPLANT
SUT ETHILON 10-0 CS-B-6CS-B-6 (SUTURE)
SUT VICRYL  9 0 (SUTURE)
SUT VICRYL 9 0 (SUTURE) IMPLANT
SUTURE EHLN 10-0 CS-B-6CS-B-6 (SUTURE) IMPLANT
SYR 3ML LL SCALE MARK (SYRINGE) ×3 IMPLANT
SYR 5ML LL (SYRINGE) ×3 IMPLANT
SYR TB 1ML LUER SLIP (SYRINGE) ×3 IMPLANT
WATER STERILE IRR 250ML POUR (IV SOLUTION) ×3 IMPLANT
WIPE NON LINTING 3.25X3.25 (MISCELLANEOUS) ×3 IMPLANT
acry sof iq toric  introcular lens (Intraocular Lens) ×3 IMPLANT

## 2017-04-11 NOTE — Op Note (Signed)
LOCATION:  [facility]   PREOPERATIVE DIAGNOSIS:  Nuclear sclerotic cataract of the right eye.  H25.11   POSTOPERATIVE DIAGNOSIS:  Nuclear sclerotic cataract of the right eye.   PROCEDURE:  Phacoemulsification with Toric posterior chamber intraocular lens placement of the right eye.   LENS:   Implant Name Type Inv. Item Serial No. Manufacturer Lot No. LRB No. Used  acry sof iq toric  introcular lens Intraocular Lens   [id number] ALCON   Right 1     [id number] 24.0 D Toric intraocular lens with 1.50 diopters of cylindrical power with axis orientation at 12 degrees.   ULTRASOUND TIME: 17 % of 1 minutes, 20 seconds.  CDE 13.8   SURGEON:  Deirdre Evenerhadwick R. Brie Eppard, MD   ANESTHESIA: Topical with tetracaine drops and 2% Xylocaine jelly, augmented with 1% preservative-free intracameral lidocaine. .   COMPLICATIONS:  None.   DESCRIPTION OF PROCEDURE:  The patient was identified in the holding room and transported to the operating suite and placed in the supine position under the operating microscope.  The right eye was identified as the operative eye, and it was prepped and draped in the usual sterile ophthalmic fashion.    A clear-corneal paracentesis incision was made at the 12:00 position.  0.5 ml of preservative-free 1% lidocaine was injected into the anterior chamber. The anterior chamber was filled with Viscoat.  A 2.4 millimeter near clear corneal incision was then made at the 9:00 position.  A cystotome and capsulorrhexis forceps were then used to make a curvilinear capsulorrhexis.  Hydrodissection and hydrodelineation were then performed using balanced salt solution.   Phacoemulsification was then used in stop and chop fashion to remove the lens, nucleus and epinucleus.  The remaining cortex was aspirated using the irrigation and aspiration handpiece.  Provisc viscoelastic was then placed into the capsular bag to distend it for lens placement.  The Verion digital marker was used to  align the implant at the intended axis.   A Toric lens was then injected into the capsular bag.  It was rotated clockwise until the axis marks on the lens were approximately 15 degrees in the counterclockwise direction to the intended alignment.  The viscoelastic was aspirated from the eye using the irrigation aspiration handpiece.  Then, a Koch spatula through the sideport incision was used to rotate the lens in a clockwise direction until the axis markings of the intraocular lens were lined up with the Verion alignment.  Balanced salt solution was then used to hydrate the wounds. Cefuroxime 0.1 ml of a 10mg /ml solution was injected into the anterior chamber for a dose of 1 mg of intracameral antibiotic at the completion of the case.    The eye was noted to have a physiologic pressure and there was no wound leak noted.   Timolol and Brimonidine drops were applied to the eye.  The patient was taken to the recovery room in stable condition having had no complications of anesthesia or surgery.  Joann Thomas 04/11/2017, 11:07 AM

## 2017-04-11 NOTE — Anesthesia Postprocedure Evaluation (Signed)
Anesthesia Post Note  Patient: Joann Thomas  Procedure(s) Performed: Procedure(s) (LRB): CATARACT EXTRACTION PHACO AND INTRAOCULAR LENS PLACEMENT (IOC)  right toric (Right)  Patient location during evaluation: PACU Anesthesia Type: MAC Level of consciousness: awake and alert and oriented Pain management: satisfactory to patient Vital Signs Assessment: post-procedure vital signs reviewed and stable Respiratory status: spontaneous breathing, nonlabored ventilation and respiratory function stable Cardiovascular status: blood pressure returned to baseline and stable Postop Assessment: Adequate PO intake and No signs of nausea or vomiting Anesthetic complications: no    Raliegh Ip

## 2017-04-11 NOTE — H&P (Signed)
The History and Physical notes are on paper, have been signed, and are to be scanned. The patient remains stable and unchanged from the H&P.   Previous H&P reviewed, patient examined, and there are no changes.  Secily Walthour 04/11/2017 10:06 AM

## 2017-04-11 NOTE — Anesthesia Preprocedure Evaluation (Signed)
Anesthesia Evaluation  Patient identified by MRN, date of birth, ID band Patient awake    Reviewed: Allergy & Precautions, H&P , NPO status , Patient's Chart, lab work & pertinent test results  Airway Mallampati: II  TM Distance: >3 FB Neck ROM: full    Dental no notable dental hx.    Pulmonary Current Smoker,    Pulmonary exam normal        Cardiovascular hypertension, Normal cardiovascular exam     Neuro/Psych TIA   GI/Hepatic GERD  ,  Endo/Other    Renal/GU      Musculoskeletal   Abdominal   Peds  Hematology   Anesthesia Other Findings   Reproductive/Obstetrics                             Anesthesia Physical Anesthesia Plan  ASA: II  Anesthesia Plan: MAC   Post-op Pain Management:    Induction:   PONV Risk Score and Plan: 1  Airway Management Planned:   Additional Equipment:   Intra-op Plan:   Post-operative Plan:   Informed Consent: I have reviewed the patients History and Physical, chart, labs and discussed the procedure including the risks, benefits and alternatives for the proposed anesthesia with the patient or authorized representative who has indicated his/her understanding and acceptance.     Plan Discussed with:   Anesthesia Plan Comments:         Anesthesia Quick Evaluation

## 2017-04-11 NOTE — Anesthesia Procedure Notes (Signed)
Procedure Name: MAC Performed by: Mayme Genta Pre-anesthesia Checklist: Patient identified, Emergency Drugs available, Suction available, Timeout performed and Patient being monitored Patient Re-evaluated:Patient Re-evaluated prior to inductionOxygen Delivery Method: Nasal cannula Placement Confirmation: positive ETCO2

## 2017-04-11 NOTE — Transfer of Care (Signed)
Immediate Anesthesia Transfer of Care Note  Patient: [name]  Procedure(s) Performed: Procedure(s) with comments: CATARACT EXTRACTION PHACO AND INTRAOCULAR LENS PLACEMENT (IOC)  right toric (Right) - Toric Lens  Patient Location: PACU  Anesthesia Type: MAC  Level of Consciousness: awake, alert  and patient cooperative  Airway and Oxygen Therapy: Patient Spontanous Breathing and Patient connected to supplemental oxygen  Post-op Assessment: Post-op Vital signs reviewed, Patient's Cardiovascular Status Stable, Respiratory Function Stable, Patent Airway and No signs of Nausea or vomiting  Post-op Vital Signs: Reviewed and stable  Complications: No apparent anesthesia complications

## 2017-04-12 ENCOUNTER — Encounter: Payer: Self-pay | Admitting: Ophthalmology

## 2017-04-20 ENCOUNTER — Ambulatory Visit
Admission: RE | Admit: 2017-04-20 | Discharge: 2017-04-20 | Disposition: A | Discharge status: Home / Self Care | Payer: Medicare Other | Source: Ambulatory Visit | Attending: Family Medicine | Admitting: Family Medicine

## 2017-04-20 DIAGNOSIS — Z1231 Encounter for screening mammogram for malignant neoplasm of breast: Secondary | ICD-10-CM

## 2017-05-16 NOTE — Discharge Instructions (Signed)

## 2017-05-17 MED ORDER — ACETAMINOPHEN 325 MG PO TABS: 325.0000 mg | ORAL_TABLET | ORAL | Status: DC | PRN

## 2017-05-17 MED ORDER — ACETAMINOPHEN 160 MG/5ML PO SOLN: 325.0000 mg | ORAL | Status: DC | PRN

## 2017-05-18 ENCOUNTER — Ambulatory Visit
Admission: RE | Admit: 2017-05-18 | Discharge: 2017-05-18 | Disposition: A | Discharge status: Home / Self Care | Payer: Medicare Other | Source: Ambulatory Visit | Attending: Ophthalmology | Admitting: Ophthalmology

## 2017-05-18 ENCOUNTER — Ambulatory Visit: Payer: Medicare Other | Admitting: Anesthesiology

## 2017-05-18 ENCOUNTER — Encounter: Payer: Self-pay | Admitting: *Deleted

## 2017-05-18 ENCOUNTER — Encounter
Admission: RE | Disposition: A | Discharge status: Home / Self Care | Payer: Self-pay | Source: Ambulatory Visit | Attending: Ophthalmology

## 2017-05-18 DIAGNOSIS — K219 Gastro-esophageal reflux disease without esophagitis: Secondary | ICD-10-CM | POA: Insufficient documentation

## 2017-05-18 DIAGNOSIS — Z87891 Personal history of nicotine dependence: Secondary | ICD-10-CM | POA: Diagnosis not present

## 2017-05-18 DIAGNOSIS — Z79899 Other long term (current) drug therapy: Secondary | ICD-10-CM | POA: Diagnosis not present

## 2017-05-18 DIAGNOSIS — I10 Essential (primary) hypertension: Secondary | ICD-10-CM | POA: Insufficient documentation

## 2017-05-18 DIAGNOSIS — Z7982 Long term (current) use of aspirin: Secondary | ICD-10-CM | POA: Diagnosis not present

## 2017-05-18 DIAGNOSIS — H2512 Age-related nuclear cataract, left eye: Secondary | ICD-10-CM | POA: Insufficient documentation

## 2017-05-18 HISTORY — DX: Chronic cough: R05.3

## 2017-05-18 HISTORY — DX: Anxiety disorder, unspecified: F41.9

## 2017-05-18 HISTORY — PX: CATARACT EXTRACTION W/PHACO: SHX586

## 2017-05-18 HISTORY — DX: Cough: R05

## 2017-05-18 HISTORY — DX: Pure hypercholesterolemia, unspecified: E78.00

## 2017-05-18 SURGERY — PHACOEMULSIFICATION, CATARACT, WITH IOL INSERTION
Anesthesia: General | Site: Eye | Laterality: Left | Wound class: Clean

## 2017-05-18 MED ORDER — CEFUROXIME OPHTHALMIC INJECTION 1 MG/0.1 ML
INJECTION | OPHTHALMIC | Status: DC | PRN
  Administered 2017-05-18: .3 mL via OPHTHALMIC

## 2017-05-18 MED ORDER — NA HYALUR & NA CHOND-NA HYALUR 0.4-0.35 ML IO KIT
PACK | INTRAOCULAR | Status: DC | PRN
  Administered 2017-05-18: 1 mL via INTRAOCULAR

## 2017-05-18 MED ORDER — ARMC OPHTHALMIC DILATING DROPS
1.0000 "application " | OPHTHALMIC | Status: DC | PRN
  Administered 2017-05-18 (×2): 1 via OPHTHALMIC

## 2017-05-18 MED ORDER — LIDOCAINE HCL (PF) 4 % IJ SOLN
INTRAOCULAR | Status: DC | PRN
  Administered 2017-05-18: 2 mL via OPHTHALMIC

## 2017-05-18 MED ORDER — FENTANYL CITRATE (PF) 100 MCG/2ML IJ SOLN
INTRAMUSCULAR | Status: DC | PRN
  Administered 2017-05-18: 50 ug via INTRAVENOUS

## 2017-05-18 MED ORDER — BSS IO SOLN
INTRAOCULAR | Status: DC | PRN
  Administered 2017-05-18: 74 mL via OPHTHALMIC

## 2017-05-18 MED ORDER — ACETAMINOPHEN 325 MG PO TABS: 325.0000 mg | ORAL_TABLET | ORAL | Status: DC | PRN

## 2017-05-18 MED ORDER — LACTATED RINGERS IV SOLN: INTRAVENOUS | Status: DC

## 2017-05-18 MED ORDER — ACETAMINOPHEN 160 MG/5ML PO SOLN: 325.0000 mg | ORAL | Status: DC | PRN

## 2017-05-18 MED ORDER — MOXIFLOXACIN HCL 0.5 % OP SOLN
1.0000 [drp] | OPHTHALMIC | Status: DC | PRN
  Administered 2017-05-18 (×3): 1 [drp] via OPHTHALMIC

## 2017-05-18 MED ORDER — BRIMONIDINE TARTRATE-TIMOLOL 0.2-0.5 % OP SOLN
OPHTHALMIC | Status: DC | PRN
  Administered 2017-05-18: 1 [drp] via OPHTHALMIC

## 2017-05-18 MED ORDER — MIDAZOLAM HCL 2 MG/2ML IJ SOLN
INTRAMUSCULAR | Status: DC | PRN
  Administered 2017-05-18: 2 mg via INTRAVENOUS

## 2017-05-18 SURGICAL SUPPLY — 25 items
ACRYSofIQ Astigmatism IOL 23.5 D (Intraocular Lens) ×3 IMPLANT
CANNULA ANT/CHMB 27GA (MISCELLANEOUS) ×3 IMPLANT
CARTRIDGE ABBOTT (MISCELLANEOUS) IMPLANT
GLOVE SURG LX 7.5 STRW (GLOVE) ×2
GLOVE SURG LX STRL 7.5 STRW (GLOVE) ×1 IMPLANT
GLOVE SURG TRIUMPH 8.0 PF LTX (GLOVE) ×3 IMPLANT
GOWN STRL REUS W/ TWL LRG LVL3 (GOWN DISPOSABLE) ×2 IMPLANT
GOWN STRL REUS W/TWL LRG LVL3 (GOWN DISPOSABLE) ×4
MARKER SKIN DUAL TIP RULER LAB (MISCELLANEOUS) ×3 IMPLANT
NDL RETROBULBAR .5 NSTRL (NEEDLE) IMPLANT
NEEDLE FILTER BLUNT 18X 1/2SAF (NEEDLE) ×2
NEEDLE FILTER BLUNT 18X1 1/2 (NEEDLE) ×1 IMPLANT
PACK CATARACT BRASINGTON (MISCELLANEOUS) ×3 IMPLANT
PACK EYE AFTER SURG (MISCELLANEOUS) ×3 IMPLANT
PACK OPTHALMIC (MISCELLANEOUS) ×3 IMPLANT
RING MALYGIN 7.0 (MISCELLANEOUS) IMPLANT
SUT ETHILON 10-0 CS-B-6CS-B-6 (SUTURE)
SUT VICRYL  9 0 (SUTURE)
SUT VICRYL 9 0 (SUTURE) IMPLANT
SUTURE EHLN 10-0 CS-B-6CS-B-6 (SUTURE) IMPLANT
SYR 3ML LL SCALE MARK (SYRINGE) ×3 IMPLANT
SYR 5ML LL (SYRINGE) ×3 IMPLANT
SYR TB 1ML LUER SLIP (SYRINGE) ×3 IMPLANT
WATER STERILE IRR 250ML POUR (IV SOLUTION) ×3 IMPLANT
WIPE NON LINTING 3.25X3.25 (MISCELLANEOUS) ×3 IMPLANT

## 2017-05-18 NOTE — Anesthesia Preprocedure Evaluation (Signed)
Anesthesia Evaluation  Patient identified by MRN, date of birth, ID band Patient awake    Reviewed: Allergy & Precautions, H&P , NPO status , Patient's Chart, lab work & pertinent test results, reviewed documented beta blocker date and time   Airway Mallampati: I  TM Distance: >3 FB Neck ROM: full    Dental no notable dental hx.    Pulmonary former smoker,    Pulmonary exam normal breath sounds clear to auscultation       Cardiovascular Exercise Tolerance: Good hypertension,  Rhythm:regular Rate:Normal     Neuro/Psych negative neurological ROS  negative psych ROS   GI/Hepatic Neg liver ROS, GERD  ,  Endo/Other  negative endocrine ROS  Renal/GU negative Renal ROS  negative genitourinary   Musculoskeletal   Abdominal   Peds  Hematology negative hematology ROS (+)   Anesthesia Other Findings   Reproductive/Obstetrics negative OB ROS                             Anesthesia Physical Anesthesia Plan  ASA: II  Anesthesia Plan: General   Post-op Pain Management:    Induction:   PONV Risk Score and Plan:   Airway Management Planned:   Additional Equipment:   Intra-op Plan:   Post-operative Plan:   Informed Consent: I have reviewed the patients History and Physical, chart, labs and discussed the procedure including the risks, benefits and alternatives for the proposed anesthesia with the patient or authorized representative who has indicated his/her understanding and acceptance.   Dental Advisory Given  Plan Discussed with: CRNA  Anesthesia Plan Comments:         Anesthesia Quick Evaluation

## 2017-05-18 NOTE — Op Note (Signed)
LOCATION:  [facility]   PREOPERATIVE DIAGNOSIS:  Nuclear sclerotic cataract of the left eye.  H25.12  POSTOPERATIVE DIAGNOSIS:  Nuclear sclerotic cataract of the left eye.   PROCEDURE:  Phacoemulsification with Toric posterior chamber intraocular lens placement of the left eye.   LENS:  Implant Name Type Inv. Item Serial No. Manufacturer Lot No. LRB No. Used  [id number] Astigmatism IOL 23.5 D Intraocular Lens   [id number] ALCON   Left 1   [id number] Toric intraocular lens with 1.50 diopters of cylindrical power with axis orientation at 12 degrees.   ULTRASOUND TIME: 16 % of 1 minutes, 29 seconds.  CDE 13.8   SURGEON:  Deirdre Evenerhadwick R. Nana Vastine, MD   ANESTHESIA:  Topical with tetracaine drops and 2% Xylocaine jelly, augmented with 1% preservative-free intracameral lidocaine.  COMPLICATIONS:  None.   DESCRIPTION OF PROCEDURE:  The patient was identified in the holding room and transported to the operating suite and placed in the supine position under the operating microscope.  The left eye was identified as the operative eye, and it was prepped and draped in the usual sterile ophthalmic fashion.    A clear-corneal paracentesis incision was made at the 1:30 position.  0.5 ml of preservative-free 1% lidocaine was injected into the anterior chamber. The anterior chamber was filled with Viscoat.  A 2.4 millimeter near clear corneal incision was then made at the 10:30 position.  A cystotome and capsulorrhexis forceps were then used to make a curvilinear capsulorrhexis.  Hydrodissection and hydrodelineation were then performed using balanced salt solution.   Phacoemulsification was then used in stop and chop fashion to remove the lens, nucleus and epinucleus.  The remaining cortex was aspirated using the irrigation and aspiration handpiece.  Provisc viscoelastic was then placed into the capsular bag to distend it for lens placement.  The Verion digital marker was used to align the implant at  the intended axis.   A 23.5 diopter lens was then injected into the capsular bag.  It was rotated clockwise until the axis marks on the lens were approximately 15 degrees in the counterclockwise direction to the intended alignment.  The viscoelastic was aspirated from the eye using the irrigation aspiration handpiece.  Then, a Koch spatula through the sideport incision was used to rotate the lens in a clockwise direction until the axis markings of the intraocular lens were lined up with the Verion alignment.  Balanced salt solution was then used to hydrate the wounds. Cefuroxime 0.1 ml of a 10mg /ml solution was injected into the anterior chamber for a dose of 1 mg of intracameral antibiotic at the completion of the case.    The eye was noted to have a physiologic pressure and there was no wound leak noted.   Timolol and Brimonidine drops were applied to the eye.  The patient was taken to the recovery room in stable condition having had no complications of anesthesia or surgery.  Joann Thomas 05/18/2017, 11:18 AM

## 2017-05-18 NOTE — H&P (Signed)
The History and Physical notes are on paper, have been signed, and are to be scanned. The patient remains stable and unchanged from the H&P.   Previous H&P reviewed, patient examined, and there are no changes.  Rya Rausch 05/18/2017 10:18 AM

## 2017-05-18 NOTE — Transfer of Care (Signed)
Immediate Anesthesia Transfer of Care Note  Patient: Joann Thomas  Procedure(s) Performed: Procedure(s) with comments: CATARACT EXTRACTION PHACO AND INTRAOCULAR LENS PLACEMENT (IOC) (Left) - IVA TOPICAL LEFT TORIC  Patient Location: PACU  Anesthesia Type: General  Level of Consciousness: awake, alert  and patient cooperative  Airway and Oxygen Therapy: Patient Spontanous Breathing and Patient connected to supplemental oxygen  Post-op Assessment: Post-op Vital signs reviewed, Patient's Cardiovascular Status Stable, Respiratory Function Stable, Patent Airway and No signs of Nausea or vomiting  Post-op Vital Signs: Reviewed and stable  Complications: No apparent anesthesia complications

## 2017-05-18 NOTE — Anesthesia Procedure Notes (Signed)
Procedure Name: MAC Performed by: Solly Derasmo Pre-anesthesia Checklist: Patient identified, Emergency Drugs available, Suction available, Timeout performed and Patient being monitored Patient Re-evaluated:Patient Re-evaluated prior to inductionOxygen Delivery Method: Nasal cannula Placement Confirmation: positive ETCO2     

## 2017-05-18 NOTE — Anesthesia Postprocedure Evaluation (Signed)
Anesthesia Post Note  Patient: Joann Thomas  Procedure(s) Performed: Procedure(s) (LRB): CATARACT EXTRACTION PHACO AND INTRAOCULAR LENS PLACEMENT (IOC) (Left)  Patient location during evaluation: PACU Anesthesia Type: General Level of consciousness: awake and alert Pain management: pain level controlled Vital Signs Assessment: post-procedure vital signs reviewed and stable Respiratory status: spontaneous breathing, nonlabored ventilation, respiratory function stable and patient connected to nasal cannula oxygen Cardiovascular status: stable and blood pressure returned to baseline Anesthetic complications: no    Alta CorningBacon, Devyne Hauger S

## 2017-05-19 ENCOUNTER — Encounter: Payer: Self-pay | Admitting: Ophthalmology

## 2017-05-20 ENCOUNTER — Encounter: Payer: Self-pay | Admitting: *Deleted

## 2017-05-23 ENCOUNTER — Ambulatory Visit: Payer: Medicare Other | Admitting: Anesthesiology

## 2017-05-23 ENCOUNTER — Encounter
Admission: RE | Disposition: A | Discharge status: Home / Self Care | Payer: Self-pay | Source: Ambulatory Visit | Attending: Unknown Physician Specialty

## 2017-05-23 ENCOUNTER — Ambulatory Visit
Admission: RE | Admit: 2017-05-23 | Discharge: 2017-05-23 | Disposition: A | Discharge status: Home / Self Care | Payer: Medicare Other | Source: Ambulatory Visit | Attending: Unknown Physician Specialty | Admitting: Unknown Physician Specialty

## 2017-05-23 DIAGNOSIS — Z96652 Presence of left artificial knee joint: Secondary | ICD-10-CM | POA: Insufficient documentation

## 2017-05-23 DIAGNOSIS — Z7951 Long term (current) use of inhaled steroids: Secondary | ICD-10-CM | POA: Insufficient documentation

## 2017-05-23 DIAGNOSIS — F329 Major depressive disorder, single episode, unspecified: Secondary | ICD-10-CM | POA: Diagnosis not present

## 2017-05-23 DIAGNOSIS — Z7982 Long term (current) use of aspirin: Secondary | ICD-10-CM | POA: Insufficient documentation

## 2017-05-23 DIAGNOSIS — E7800 Pure hypercholesterolemia, unspecified: Secondary | ICD-10-CM | POA: Insufficient documentation

## 2017-05-23 DIAGNOSIS — K219 Gastro-esophageal reflux disease without esophagitis: Secondary | ICD-10-CM | POA: Insufficient documentation

## 2017-05-23 DIAGNOSIS — Z808 Family history of malignant neoplasm of other organs or systems: Secondary | ICD-10-CM | POA: Diagnosis not present

## 2017-05-23 DIAGNOSIS — Z87891 Personal history of nicotine dependence: Secondary | ICD-10-CM | POA: Insufficient documentation

## 2017-05-23 DIAGNOSIS — F419 Anxiety disorder, unspecified: Secondary | ICD-10-CM | POA: Diagnosis not present

## 2017-05-23 DIAGNOSIS — K2970 Gastritis, unspecified, without bleeding: Secondary | ICD-10-CM | POA: Insufficient documentation

## 2017-05-23 DIAGNOSIS — Z8041 Family history of malignant neoplasm of ovary: Secondary | ICD-10-CM | POA: Diagnosis not present

## 2017-05-23 DIAGNOSIS — I10 Essential (primary) hypertension: Secondary | ICD-10-CM | POA: Insufficient documentation

## 2017-05-23 DIAGNOSIS — Z79899 Other long term (current) drug therapy: Secondary | ICD-10-CM | POA: Insufficient documentation

## 2017-05-23 DIAGNOSIS — R12 Heartburn: Secondary | ICD-10-CM | POA: Insufficient documentation

## 2017-05-23 HISTORY — DX: Depression, unspecified: F32.A

## 2017-05-23 HISTORY — PX: ESOPHAGOGASTRODUODENOSCOPY (EGD) WITH PROPOFOL: SHX5813

## 2017-05-23 HISTORY — DX: Major depressive disorder, single episode, unspecified: F32.9

## 2017-05-23 SURGERY — ESOPHAGOGASTRODUODENOSCOPY (EGD) WITH PROPOFOL
Anesthesia: General

## 2017-05-23 MED ORDER — LIDOCAINE HCL (PF) 2 % IJ SOLN
INTRAMUSCULAR | Status: DC | PRN
  Administered 2017-05-23: 50 mg

## 2017-05-23 MED ORDER — MIDAZOLAM HCL 5 MG/5ML IJ SOLN
INTRAMUSCULAR | Status: DC | PRN
  Administered 2017-05-23: 2 mg via INTRAVENOUS

## 2017-05-23 MED ORDER — SODIUM CHLORIDE 0.9 % IV SOLN: INTRAVENOUS | Status: DC

## 2017-05-23 MED ORDER — SODIUM CHLORIDE 0.9 % IV SOLN
INTRAVENOUS | Status: DC
  Administered 2017-05-23: 11:00:00 via INTRAVENOUS

## 2017-05-23 MED ORDER — PIPERACILLIN-TAZOBACTAM 3.375 G IVPB
INTRAVENOUS | Status: AC
  Filled 2017-05-23: qty 50

## 2017-05-23 MED ORDER — PROPOFOL 500 MG/50ML IV EMUL
INTRAVENOUS | Status: AC
  Filled 2017-05-23: qty 50

## 2017-05-23 MED ORDER — PROPOFOL 10 MG/ML IV BOLUS
INTRAVENOUS | Status: DC | PRN
  Administered 2017-05-23 (×2): 20 mg via INTRAVENOUS

## 2017-05-23 MED ORDER — FENTANYL CITRATE (PF) 100 MCG/2ML IJ SOLN
INTRAMUSCULAR | Status: DC | PRN
  Administered 2017-05-23: 25 ug via INTRAVENOUS

## 2017-05-23 MED ORDER — MIDAZOLAM HCL 2 MG/2ML IJ SOLN
INTRAMUSCULAR | Status: AC
  Filled 2017-05-23: qty 2

## 2017-05-23 MED ORDER — PROPOFOL 500 MG/50ML IV EMUL
INTRAVENOUS | Status: DC | PRN
  Administered 2017-05-23: 50 ug/kg/min via INTRAVENOUS

## 2017-05-23 MED ORDER — PIPERACILLIN-TAZOBACTAM 3.375 G IVPB 30 MIN
3.3750 g | Freq: Once | INTRAVENOUS | Status: AC
  Administered 2017-05-23: 3.375 g via INTRAVENOUS
  Filled 2017-05-23: qty 50

## 2017-05-23 MED ORDER — FENTANYL CITRATE (PF) 100 MCG/2ML IJ SOLN
INTRAMUSCULAR | Status: AC
  Filled 2017-05-23: qty 2

## 2017-05-23 NOTE — H&P (Signed)
Primary Care Physician:  Dorothey BasemanBronstein, David, MD Primary Gastroenterologist:  Dr. Mechele CollinElliott  Pre-Procedure History & Physical: HPI:  Joann Thomas is a 76 y.o. female is here for an upper endoscopy for heartburn/GERD   Past Medical History:  Diagnosis Date  . Anxiety   . Chronic cough   . Depression   . GERD (gastroesophageal reflux disease)   . Hypercholesterolemia   . Hypertension     Past Surgical History:  Procedure Laterality Date  . BREAST BIOPSY Left   . CATARACT EXTRACTION W/PHACO Right 04/11/2017   Procedure: CATARACT EXTRACTION PHACO AND INTRAOCULAR LENS PLACEMENT (IOC)  right toric;  Surgeon: Lockie MolaBrasington, Chadwick, MD;  [facility];  Service: Ophthalmology;  Laterality: Right;  Toric Lens  . CATARACT EXTRACTION W/PHACO Left 05/18/2017   Procedure: CATARACT EXTRACTION PHACO AND INTRAOCULAR LENS PLACEMENT (IOC);  Surgeon: Lockie MolaBrasington, Chadwick, MD;  [facility];  Service: Ophthalmology;  Laterality: Left;  IVA TOPICAL LEFT TORIC  . COLONOSCOPY    . REPLACEMENT TOTAL KNEE Left 2011  . VARICOSE VEIN SURGERY Right 1973    Prior to Admission medications   Medication Sig Start Date End Date Taking? Authorizing Provider  amLODipine (NORVASC) 10 MG tablet Take 10 mg by mouth daily.   Yes [provider]  aspirin EC 81 MG EC tablet Take 1 tablet (81 mg total) by mouth daily. 06/01/16  Yes Katharina CaperVaickute, Rima, MD  atorvastatin (LIPITOR) 40 MG tablet Take 1 tablet (40 mg total) by mouth daily at 6 PM. 06/01/16  Yes Katharina CaperVaickute, Rima, MD  Calcium Carbonate-Vit D-Min (CALCIUM 1200 PO) Take 1 tablet by mouth.   Yes [provider]  Cholecalciferol (VITAMIN D PO) Take by mouth.   Yes [provider]  fluticasone (FLONASE) 50 MCG/ACT nasal spray Place 2 sprays into both nostrils daily.   Yes [provider]  losartan (COZAAR) 50 MG tablet Take 50 mg by mouth daily.   Yes [provider]  Omega-3 Fatty Acids (FISH OIL  PO) Take 1 capsule by mouth.   Yes [provider]  omeprazole (PRILOSEC) 20 MG capsule Take 20 mg by mouth daily.   Yes [provider]  PARoxetine (PAXIL) 10 MG tablet Take 20 mg by mouth daily.    Yes [provider]  albuterol (PROVENTIL HFA;VENTOLIN HFA) 108 (90 Base) MCG/ACT inhaler Inhale 2 puffs into the lungs every 6 (six) hours as needed for wheezing or shortness of breath.    [provider]  amoxicillin (AMOXIL) 500 MG capsule Take by mouth. Take 4 capsules one hour before dental procedure    [provider]    Allergies as of 05/19/2017 - Review Complete 05/18/2017  Allergen Reaction Noted  . Zithromax [azithromycin] Rash 05/31/2016    Family History  Problem Relation Age of Onset  . Pancreatic cancer Father [id number]  . Ovarian cancer Paternal Aunt   . Breast cancer Neg Hx     Social History   Social History  . Marital status: Married    Spouse name: N/A  . Number of children: N/A  . Years of education: N/A   Occupational History  . Not on file.   Social History Main Topics  . Smoking status: Former Smoker    Packs/day: 0.50    Quit date: 10/11/2016  . Smokeless tobacco: Never Used  . Alcohol use 0.6 oz/week    1 Glasses of wine per week     Comment: occasional drink  . Drug use: No  .  Sexual activity: Not on file   Other Topics Concern  . Not on file   Social History Narrative  . No narrative on file    Review of Systems: See HPI, otherwise negative ROS  Physical Exam: BP (!) 141/77   Pulse 61   Temp (!) 96.9 F (36.1 C) (Tympanic)   Resp 18   Ht 5\' 5"  (1.651 m)   Wt 63.5 kg (140 lb)   SpO2 98%   BMI 23.30 kg/m  General:   Alert,  pleasant and cooperative in NAD Head:  Normocephalic and atraumatic. Neck:  Supple; no masses or thyromegaly. Lungs:  Clear throughout to auscultation.    Heart:  Regular rate and rhythm. Abdomen:  Soft, nontender and nondistended. Normal bowel sounds, without guarding, and  without rebound.   Neurologic:  Alert and  oriented x4;  grossly normal neurologically.  Impression/Plan: Joann Thomas is here for an endoscopy to be performed for GERD  Risks, benefits, limitations, and alternatives regarding  endoscopy have been reviewed with the patient.  Questions have been answered.  All parties agreeable.   Lynnae Prude, MD  05/23/2017, 11:20 AM

## 2017-05-23 NOTE — Anesthesia Preprocedure Evaluation (Signed)
Anesthesia Evaluation  Patient identified by MRN, date of birth, ID band Patient awake    Reviewed: Allergy & Precautions, NPO status , Patient's Chart, lab work & pertinent test results  History of Anesthesia Complications Negative for: history of anesthetic complications  Airway Mallampati: I       Dental   Pulmonary former smoker,           Cardiovascular hypertension, Pt. on medications      Neuro/Psych Anxiety Depression TIA   GI/Hepatic Neg liver ROS, GERD  Medicated,  Endo/Other  neg diabetes  Renal/GU negative Renal ROS     Musculoskeletal   Abdominal   Peds  Hematology   Anesthesia Other Findings   Reproductive/Obstetrics                             Anesthesia Physical Anesthesia Plan  ASA: II  Anesthesia Plan: General   Post-op Pain Management:    Induction: Intravenous  PONV Risk Score and Plan:   Airway Management Planned:   Additional Equipment:   Intra-op Plan:   Post-operative Plan:   Informed Consent: I have reviewed the patients History and Physical, chart, labs and discussed the procedure including the risks, benefits and alternatives for the proposed anesthesia with the patient or authorized representative who has indicated his/her understanding and acceptance.     Plan Discussed with:   Anesthesia Plan Comments:         Anesthesia Quick Evaluation

## 2017-05-23 NOTE — Op Note (Signed)
[facility] Gastroenterology Patient Name: Joann Thomas Procedure Date: 05/23/2017 11:21 AM MRN: [id number] Account #: [id number] Date of Birth: 1941/05/19 Admit Type: Outpatient Age: [age] [facility] 3 Gender: Female Note Status: Finalized Procedure:            Upper GI endoscopy Indications:          Heartburn Providers:            Scot Junobert T. Elliott, MD Referring MD:         Teena Iraniavid M. Terance HartBronstein, MD (Referring MD) Medicines:            Propofol per Anesthesia Complications:        No immediate complications. Procedure:            Pre-Anesthesia Assessment:                       - After reviewing the risks and benefits, the patient                        was deemed in satisfactory condition to undergo the                        procedure.                       After obtaining informed consent, the endoscope was                        passed under direct vision. Throughout the procedure,                        the patient's blood pressure, pulse, and oxygen                        saturations were monitored continuously. The                        Colonoscope was introduced through the mouth, and                        advanced to the second part of duodenum. The upper GI                        endoscopy was accomplished without difficulty. The                        patient tolerated the procedure well. Findings:      Localized minimal inflammation characterized by erythema and granularity       was found in the cardia. Biopsies were taken with a cold forceps for       histology.      Diffuse mild inflammation characterized by erythema and granularity was       found in the gastric antrum. Biopsies were taken with a cold forceps for       histology. Biopsies were taken with a cold forceps for Helicobacter       pylori testing. Body looked good but biopsies done.      The examined duodenum was normal.      The Z-line was irregular and was found 40 cm from  the incisors.       Esophagus otherwise normal. Impression:           -  Normal esophagus.                       - Gastritis. Biopsied.                       - Gastritis. Biopsied.                       - Normal examined duodenum. Recommendation:       - Await pathology results. medication as needed. Scot Jun, MD 05/23/2017 11:36:01 AM This report has been signed electronically. Number of Addenda: 0 Note Initiated On: 05/23/2017 11:21 AM      [facility]

## 2017-05-23 NOTE — Transfer of Care (Signed)
Immediate Anesthesia Transfer of Care Note  Patient: Joann Thomas  Procedure(s) Performed: Procedure(s): ESOPHAGOGASTRODUODENOSCOPY (EGD) WITH PROPOFOL (N/A)  Patient Location: PACU  Anesthesia Type:General  Level of Consciousness: sedated  Airway & Oxygen Therapy: Patient Spontanous Breathing and Patient connected to nasal cannula oxygen  Post-op Assessment: Report given to RN and Post -op Vital signs reviewed and stable  Post vital signs: Reviewed and stable  Last Vitals:  Vitals:   05/23/17 1019 05/23/17 1137  BP: (!) 141/77   Pulse: 61   Resp: 18   Temp: (!) 36.1 C (!) 35.6 C  SpO2: 98% (!) 2%    Last Pain:  Vitals:   05/23/17 1137  TempSrc: Tympanic  PainSc:          Complications: No apparent anesthesia complications

## 2017-05-23 NOTE — Anesthesia Post-op Follow-up Note (Signed)
Anesthesia QCDR form completed.        

## 2017-05-23 NOTE — Anesthesia Postprocedure Evaluation (Signed)
Anesthesia Post Note  Patient: Leafy KindleLynne C Conrow  Procedure(s) Performed: Procedure(s) (LRB): ESOPHAGOGASTRODUODENOSCOPY (EGD) WITH PROPOFOL (N/A)  Patient location during evaluation: Endoscopy Anesthesia Type: General Level of consciousness: awake and alert Pain management: pain level controlled Vital Signs Assessment: post-procedure vital signs reviewed and stable Respiratory status: spontaneous breathing and respiratory function stable Cardiovascular status: stable Anesthetic complications: no     Last Vitals:  Vitals:   05/23/17 1137 05/23/17 1147  BP: (!) 108/51 (!) 133/58  Pulse: 62   Resp: 15   Temp: (!) 35.6 C   SpO2: 96%     Last Pain:  Vitals:   05/23/17 1137  TempSrc: Tympanic  PainSc: 0-No pain                 Luvern Mischke K

## 2017-05-24 ENCOUNTER — Encounter: Payer: Self-pay | Admitting: Unknown Physician Specialty

## 2017-05-24 LAB — SURGICAL PATHOLOGY

## 2017-06-23 ENCOUNTER — Encounter: Payer: Self-pay | Admitting: Cardiology

## 2017-06-23 ENCOUNTER — Ambulatory Visit
Admission: RE | Admit: 2017-06-23 | Discharge: 2017-06-23 | Disposition: A | Discharge status: Home / Self Care | Payer: Medicare Other | Source: Ambulatory Visit | Attending: Cardiology | Admitting: Cardiology

## 2017-06-23 ENCOUNTER — Encounter
Admission: RE | Disposition: A | Discharge status: Home / Self Care | Payer: Self-pay | Source: Ambulatory Visit | Attending: Cardiology

## 2017-06-23 DIAGNOSIS — Z881 Allergy status to other antibiotic agents status: Secondary | ICD-10-CM | POA: Insufficient documentation

## 2017-06-23 DIAGNOSIS — Z86010 Personal history of colonic polyps: Secondary | ICD-10-CM | POA: Insufficient documentation

## 2017-06-23 DIAGNOSIS — Z87891 Personal history of nicotine dependence: Secondary | ICD-10-CM | POA: Insufficient documentation

## 2017-06-23 DIAGNOSIS — Z800 Family history of malignant neoplasm of digestive organs: Secondary | ICD-10-CM | POA: Insufficient documentation

## 2017-06-23 DIAGNOSIS — Z79899 Other long term (current) drug therapy: Secondary | ICD-10-CM | POA: Insufficient documentation

## 2017-06-23 DIAGNOSIS — Z7982 Long term (current) use of aspirin: Secondary | ICD-10-CM | POA: Diagnosis not present

## 2017-06-23 DIAGNOSIS — R600 Localized edema: Secondary | ICD-10-CM | POA: Diagnosis not present

## 2017-06-23 DIAGNOSIS — Z91041 Radiographic dye allergy status: Secondary | ICD-10-CM | POA: Diagnosis not present

## 2017-06-23 DIAGNOSIS — Z888 Allergy status to other drugs, medicaments and biological substances status: Secondary | ICD-10-CM | POA: Insufficient documentation

## 2017-06-23 DIAGNOSIS — Z8673 Personal history of transient ischemic attack (TIA), and cerebral infarction without residual deficits: Secondary | ICD-10-CM | POA: Insufficient documentation

## 2017-06-23 DIAGNOSIS — R0600 Dyspnea, unspecified: Secondary | ICD-10-CM | POA: Insufficient documentation

## 2017-06-23 DIAGNOSIS — Z9660 Presence of unspecified orthopedic joint implant: Secondary | ICD-10-CM | POA: Diagnosis not present

## 2017-06-23 DIAGNOSIS — M8580 Other specified disorders of bone density and structure, unspecified site: Secondary | ICD-10-CM | POA: Diagnosis not present

## 2017-06-23 DIAGNOSIS — I272 Pulmonary hypertension, unspecified: Secondary | ICD-10-CM | POA: Insufficient documentation

## 2017-06-23 DIAGNOSIS — F419 Anxiety disorder, unspecified: Secondary | ICD-10-CM | POA: Insufficient documentation

## 2017-06-23 DIAGNOSIS — I2721 Secondary pulmonary arterial hypertension: Secondary | ICD-10-CM | POA: Diagnosis present

## 2017-06-23 DIAGNOSIS — Z8261 Family history of arthritis: Secondary | ICD-10-CM | POA: Insufficient documentation

## 2017-06-23 DIAGNOSIS — R05 Cough: Secondary | ICD-10-CM | POA: Diagnosis not present

## 2017-06-23 DIAGNOSIS — E785 Hyperlipidemia, unspecified: Secondary | ICD-10-CM | POA: Insufficient documentation

## 2017-06-23 HISTORY — PX: RIGHT HEART CATH: CATH118263

## 2017-06-23 SURGERY — RIGHT HEART CATH
Anesthesia: Moderate Sedation | Laterality: Right

## 2017-06-23 MED ORDER — ASPIRIN 81 MG PO CHEW: 81.0000 mg | CHEWABLE_TABLET | ORAL | Status: DC

## 2017-06-23 MED ORDER — FENTANYL CITRATE (PF) 100 MCG/2ML IJ SOLN
INTRAMUSCULAR | Status: AC
  Filled 2017-06-23: qty 2

## 2017-06-23 MED ORDER — MIDAZOLAM HCL 2 MG/2ML IJ SOLN
INTRAMUSCULAR | Status: AC
  Filled 2017-06-23: qty 2

## 2017-06-23 MED ORDER — SODIUM CHLORIDE 0.9 % IV SOLN: 250.0000 mL | INTRAVENOUS | Status: DC | PRN

## 2017-06-23 MED ORDER — SODIUM CHLORIDE 0.9 % WEIGHT BASED INFUSION: 1.0000 mL/kg/h | INTRAVENOUS | Status: DC

## 2017-06-23 MED ORDER — SODIUM CHLORIDE 0.9% FLUSH: 3.0000 mL | INTRAVENOUS | Status: DC | PRN

## 2017-06-23 MED ORDER — FENTANYL CITRATE (PF) 100 MCG/2ML IJ SOLN
INTRAMUSCULAR | Status: DC | PRN
  Administered 2017-06-23: 25 ug via INTRAVENOUS

## 2017-06-23 MED ORDER — HEPARIN (PORCINE) IN NACL 2-0.9 UNIT/ML-% IJ SOLN
INTRAMUSCULAR | Status: AC
  Filled 2017-06-23: qty 500

## 2017-06-23 MED ORDER — SODIUM CHLORIDE 0.9% FLUSH: 3.0000 mL | Freq: Two times a day (BID) | INTRAVENOUS | Status: DC

## 2017-06-23 MED ORDER — MIDAZOLAM HCL 2 MG/2ML IJ SOLN
INTRAMUSCULAR | Status: DC | PRN
  Administered 2017-06-23: 1 mg via INTRAVENOUS

## 2017-06-23 MED ORDER — SODIUM CHLORIDE 0.9 % WEIGHT BASED INFUSION
3.0000 mL/kg/h | INTRAVENOUS | Status: AC
  Administered 2017-06-23: 3 mL/kg/h via INTRAVENOUS

## 2017-06-23 MED ORDER — SODIUM CHLORIDE 0.9 % WEIGHT BASED INFUSION: 3.0000 mL/kg/h | INTRAVENOUS | Status: AC

## 2017-06-23 MED ORDER — LIDOCAINE HCL (PF) 1 % IJ SOLN
INTRAMUSCULAR | Status: AC
  Filled 2017-06-23: qty 30

## 2017-06-23 SURGICAL SUPPLY — 6 items
CATH SWANZ 7F THERMO (CATHETERS) ×3 IMPLANT
GUIDEWIRE EMER 3M J .025X150CM (WIRE) ×3 IMPLANT
KIT MANI 3VAL PERCEP (MISCELLANEOUS) ×3 IMPLANT
NEEDLE PERC 18GX7CM (NEEDLE) ×3 IMPLANT
PACK CARDIAC CATH (CUSTOM PROCEDURE TRAY) ×3 IMPLANT
SHEATH PINNACLE 7F 10CM (SHEATH) ×3 IMPLANT

## 2017-06-23 NOTE — Discharge Instructions (Signed)
Check right groin for bleeding or hematoma.  Patient will be on bedrest for 2 hours post sheath pull---out of bed at______________.  Bilateral pulses are ___________.The drugs you were given will stay in your system until tomorrow, so for the next 24 hours you should not.  Drive an automobile. Make any legal decisions.  Drink any alcoholic beverages.  Today you should start with liquids and gradually work up to solid foods as your are able to tolerate them  Resume your regular medications as prescribed by your doctor.  Avoid aspirin for 24 hours.    Change the Band-Aid or dressing as needed.  After a 2 days no dressing as needed.  Avoid strenuous activity for the remainder of the day.  Please notify your primary physician immediately if you have any unusual bleeding, trouble breathing, fever >100 degrees or pain not relieved by the medication your doctor prescribed for your doctor prescribed for you physician  Return to diaslysis  tommorow.

## 2017-07-07 DIAGNOSIS — I2721 Secondary pulmonary arterial hypertension: Secondary | ICD-10-CM | POA: Diagnosis present

## 2018-01-30 ENCOUNTER — Other Ambulatory Visit: Payer: Self-pay | Admitting: *Deleted

## 2018-01-30 ENCOUNTER — Telehealth: Payer: Self-pay | Admitting: *Deleted

## 2018-01-30 DIAGNOSIS — Z87891 Personal history of nicotine dependence: Secondary | ICD-10-CM

## 2018-01-30 DIAGNOSIS — Z122 Encounter for screening for malignant neoplasm of respiratory organs: Secondary | ICD-10-CM

## 2018-01-30 NOTE — Telephone Encounter (Signed)
Received referral for initial lung cancer screening scan. Contacted patient and obtained smoking history,(former, quit 2018, 41.25 pack year) as well as answering questions related to screening process. Patient denies signs of lung cancer such as weight loss or hemoptysis. Patient denies comorbidity that would prevent curative treatment if lung cancer were found. Patient is scheduled for shared decision making visit and CT scan on 02/09/18.

## 2018-02-08 ENCOUNTER — Encounter: Payer: Self-pay | Admitting: Nurse Practitioner

## 2018-02-09 ENCOUNTER — Ambulatory Visit
Admission: RE | Admit: 2018-02-09 | Discharge: 2018-02-09 | Disposition: A | Discharge status: Home / Self Care | Payer: Medicare Other | Source: Ambulatory Visit | Attending: Nurse Practitioner | Admitting: Nurse Practitioner

## 2018-02-09 ENCOUNTER — Inpatient Hospital Stay: Payer: Medicare Other | Attending: Nurse Practitioner | Admitting: Nurse Practitioner

## 2018-02-09 DIAGNOSIS — Z122 Encounter for screening for malignant neoplasm of respiratory organs: Secondary | ICD-10-CM

## 2018-02-09 DIAGNOSIS — Z87891 Personal history of nicotine dependence: Secondary | ICD-10-CM

## 2018-02-09 DIAGNOSIS — I2510 Atherosclerotic heart disease of native coronary artery without angina pectoris: Secondary | ICD-10-CM | POA: Diagnosis not present

## 2018-02-09 DIAGNOSIS — J439 Emphysema, unspecified: Secondary | ICD-10-CM | POA: Insufficient documentation

## 2018-02-09 DIAGNOSIS — I700 Atherosclerosis of aorta: Secondary | ICD-10-CM | POA: Diagnosis not present

## 2018-02-09 NOTE — Progress Notes (Signed)
In accordance with CMS guidelines, patient has met eligibility criteria including age, absence of signs or symptoms of lung cancer.  Social History   Tobacco Use  . Smoking status: Former Smoker    Packs/day: 0.75    Years: 55.00    Pack years: 41.25    Last attempt to quit: 10/11/2016    Years since quitting: 1.3  . Smokeless tobacco: Never Used  Substance Use Topics  . Alcohol use: Yes    Alcohol/week: 0.6 oz    Types: 1 Glasses of wine per week    Comment: occasional drink  . Drug use: No      A shared decision-making session was conducted prior to the performance of CT scan. This includes one or more decision aids, includes benefits and harms of screening, follow-up diagnostic testing, over-diagnosis, false positive rate, and total radiation exposure.   Counseling on the importance of adherence to annual lung cancer LDCT screening, impact of co-morbidities, and ability or willingness to undergo diagnosis and treatment is imperative for compliance of the program.   Counseling on the importance of continued smoking cessation for former smokers; the importance of smoking cessation for current smokers, and information about tobacco cessation interventions have been given to patient including [place] and 1800 quit [place] programs.   Written order for lung cancer screening with LDCT has been given to the patient and any and all questions have been answered to the best of my abilities.    Yearly follow up will be coordinated by Burgess Estelle, Thoracic Navigator.  Beckey Rutter, DNP, AGNP-C [place] [facility] [phone] (work cell) [phone] (office) 02/09/18 3:19 PM

## 2018-02-13 ENCOUNTER — Telehealth: Payer: Self-pay | Admitting: *Deleted

## 2018-02-13 NOTE — Telephone Encounter (Signed)
Notified patient of LDCT lung cancer screening program results. Also notified of incidental findings noted below and is encouraged to discuss further with PCP who will receive a copy of this note and/or the CT report. Patient verbalizes understanding. 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 At that time it measured 2.7cm. Also, patient is aware that Medicare will not recommend continued lung cancer screening.

## 2018-02-16 ENCOUNTER — Other Ambulatory Visit: Payer: Self-pay | Admitting: Family Medicine

## 2018-02-16 DIAGNOSIS — I714 Abdominal aortic aneurysm, without rupture, unspecified: Secondary | ICD-10-CM

## 2018-02-20 ENCOUNTER — Ambulatory Visit
Admission: RE | Admit: 2018-02-20 | Discharge: 2018-02-20 | Disposition: A | Discharge status: Home / Self Care | Payer: Medicare Other | Source: Ambulatory Visit | Attending: Family Medicine | Admitting: Family Medicine

## 2018-02-20 DIAGNOSIS — I714 Abdominal aortic aneurysm, without rupture, unspecified: Secondary | ICD-10-CM

## 2018-06-02 ENCOUNTER — Other Ambulatory Visit: Payer: Self-pay | Admitting: Family Medicine

## 2018-06-02 DIAGNOSIS — Z1231 Encounter for screening mammogram for malignant neoplasm of breast: Secondary | ICD-10-CM

## 2018-06-20 ENCOUNTER — Ambulatory Visit
Admission: RE | Admit: 2018-06-20 | Discharge: 2018-06-20 | Disposition: A | Discharge status: Home / Self Care | Payer: Medicare Other | Source: Ambulatory Visit | Attending: Family Medicine | Admitting: Family Medicine

## 2018-06-20 DIAGNOSIS — Z1231 Encounter for screening mammogram for malignant neoplasm of breast: Secondary | ICD-10-CM | POA: Diagnosis present

## 2019-02-01 ENCOUNTER — Encounter: Payer: Self-pay | Admitting: *Deleted

## 2019-03-31 ENCOUNTER — Telehealth: Payer: Self-pay | Admitting: *Deleted

## 2019-03-31 NOTE — Telephone Encounter (Signed)
Patient has been notified that lung cancer screening CT scan is due currently or will be in near future. Confirmed that patient is within the appropriate age range, and asymptomatic, (no signs or symptoms of lung cancer). Patient denies illness that would prevent curative treatment for lung cancer if found. Verified smoking history: Current smoker, smokes 12 cigarettes a day. She would also be interested in.  Patient is agreeable for CT scan being scheduled.

## 2019-04-04 ENCOUNTER — Other Ambulatory Visit: Payer: Self-pay | Admitting: *Deleted

## 2019-04-04 DIAGNOSIS — Z87891 Personal history of nicotine dependence: Secondary | ICD-10-CM

## 2019-04-04 DIAGNOSIS — Z122 Encounter for screening for malignant neoplasm of respiratory organs: Secondary | ICD-10-CM

## 2019-04-16 ENCOUNTER — Other Ambulatory Visit: Payer: Self-pay

## 2019-04-16 ENCOUNTER — Ambulatory Visit
Admission: RE | Admit: 2019-04-16 | Discharge: 2019-04-16 | Disposition: A | Discharge status: Home / Self Care | Payer: Medicare Other | Source: Ambulatory Visit | Attending: Oncology | Admitting: Oncology

## 2019-04-16 DIAGNOSIS — Z87891 Personal history of nicotine dependence: Secondary | ICD-10-CM | POA: Diagnosis present

## 2019-04-16 DIAGNOSIS — J439 Emphysema, unspecified: Secondary | ICD-10-CM | POA: Diagnosis not present

## 2019-04-16 DIAGNOSIS — I714 Abdominal aortic aneurysm, without rupture: Secondary | ICD-10-CM | POA: Diagnosis not present

## 2019-04-16 DIAGNOSIS — N1330 Unspecified hydronephrosis: Secondary | ICD-10-CM | POA: Insufficient documentation

## 2019-04-16 DIAGNOSIS — Z122 Encounter for screening for malignant neoplasm of respiratory organs: Secondary | ICD-10-CM | POA: Insufficient documentation

## 2019-04-18 ENCOUNTER — Encounter: Payer: Self-pay | Admitting: *Deleted

## 2019-04-20 ENCOUNTER — Telehealth: Payer: Self-pay | Admitting: *Deleted

## 2019-04-20 NOTE — Telephone Encounter (Signed)
Notified patient of LDCT lung cancer screening program results with recommendation for 12 month follow up imaging. Also notified of incidental findings noted below and is encouraged to discuss further with PCP who will receive a copy of this note and/or the CT report. Patient verbalizes understanding.   IMPRESSION: 1. Lung-RADS 1S, negative. Continue annual screening with low-dose chest CT without contrast in 12 months. 2. The "S" modifier represents a potentially clinically significant non pulmonary finding. Incompletely imaged infrarenal abdominal aortic dilatation. Recommend further evaluation with dedicated ultrasound. 3. Aortic atherosclerosis (ICD10-I70.0), coronary artery atherosclerosis and emphysema (ICD10-J43.9).  These results will be called to the ordering clinician or representative by the Radiologist Assistant, and communication documented in the PACS or zVision Dashboard.

## 2019-05-21 ENCOUNTER — Other Ambulatory Visit: Payer: Self-pay | Admitting: Family Medicine

## 2019-05-21 DIAGNOSIS — Z1231 Encounter for screening mammogram for malignant neoplasm of breast: Secondary | ICD-10-CM

## 2019-07-24 ENCOUNTER — Ambulatory Visit
Admission: RE | Admit: 2019-07-24 | Discharge: 2019-07-24 | Disposition: A | Discharge status: Home / Self Care | Payer: Medicare Other | Source: Ambulatory Visit | Attending: Family Medicine | Admitting: Family Medicine

## 2019-07-24 DIAGNOSIS — Z1231 Encounter for screening mammogram for malignant neoplasm of breast: Secondary | ICD-10-CM

## 2020-05-02 ENCOUNTER — Telehealth: Payer: Self-pay | Admitting: *Deleted

## 2020-05-02 NOTE — Telephone Encounter (Signed)
(  05/02/2020) Left message for pt to notify them that it is time to schedule annual low dose lung cancer screening CT scan. Instructed patient to call back to verify information prior to the scan being scheduled °SRW °  ° ° °

## 2020-05-13 ENCOUNTER — Telehealth: Payer: Self-pay | Admitting: *Deleted

## 2020-05-13 NOTE — Telephone Encounter (Signed)
Left message for patient to notify them that it is time to schedule annual low dose lung cancer screening CT scan. Instructed patient to call back to verify information prior to the scan being scheduled.  

## 2020-05-20 ENCOUNTER — Telehealth: Payer: Self-pay | Admitting: *Deleted

## 2020-05-20 DIAGNOSIS — Z87891 Personal history of nicotine dependence: Secondary | ICD-10-CM

## 2020-05-20 DIAGNOSIS — Z122 Encounter for screening for malignant neoplasm of respiratory organs: Secondary | ICD-10-CM

## 2020-05-20 NOTE — Telephone Encounter (Signed)
(  05/20/2020) Left message for pt to notify them that it is time to schedule annual low dose lung cancer screening CT scan. Instructed patient to call back to verify information prior to the scan being scheduled SRW     

## 2020-06-18 ENCOUNTER — Other Ambulatory Visit: Payer: Self-pay | Admitting: Family Medicine

## 2020-06-18 DIAGNOSIS — Z1231 Encounter for screening mammogram for malignant neoplasm of breast: Secondary | ICD-10-CM

## 2020-06-23 NOTE — Addendum Note (Signed)
Addended by: Jonne Ply on: 06/23/2020 01:30 PM   Modules accepted: Orders

## 2020-06-23 NOTE — Telephone Encounter (Signed)
Contacted and scheduled 

## 2020-07-10 ENCOUNTER — Ambulatory Visit: Admission: RE | Admit: 2020-07-10 | Payer: Medicare Other | Source: Ambulatory Visit

## 2020-07-16 ENCOUNTER — Telehealth: Payer: Self-pay | Admitting: *Deleted

## 2020-07-16 NOTE — Telephone Encounter (Signed)
Attempted to contact and schedule lung screening scan. Message left for patient to call back to schedule. 

## 2020-07-18 ENCOUNTER — Telehealth: Payer: Self-pay

## 2020-07-18 NOTE — Telephone Encounter (Signed)
Contacted patient to schedule yearly lung CT scan.  She had to cancel her scan on 09/30 due to side effects of COVID vaccine. She received her second COVID dose on 07/13/2020.  CT scheduled for Monday, Oct 18 at 2:30.

## 2020-07-24 ENCOUNTER — Ambulatory Visit
Admission: RE | Admit: 2020-07-24 | Discharge: 2020-07-24 | Disposition: A | Discharge status: Home / Self Care | Payer: Medicare Other | Source: Ambulatory Visit | Attending: Family Medicine | Admitting: Family Medicine

## 2020-07-24 ENCOUNTER — Other Ambulatory Visit: Payer: Self-pay

## 2020-07-24 DIAGNOSIS — Z1231 Encounter for screening mammogram for malignant neoplasm of breast: Secondary | ICD-10-CM | POA: Insufficient documentation

## 2020-07-28 ENCOUNTER — Telehealth: Payer: Self-pay | Admitting: *Deleted

## 2020-07-28 NOTE — Telephone Encounter (Signed)
Patient reports having some trouble with asthma and would like to reschedule her lung screening scan. Rescheduled per her request.

## 2020-08-14 ENCOUNTER — Other Ambulatory Visit: Payer: Self-pay

## 2020-08-14 ENCOUNTER — Ambulatory Visit
Admission: RE | Admit: 2020-08-14 | Discharge: 2020-08-14 | Disposition: A | Discharge status: Home / Self Care | Payer: Medicare Other | Source: Ambulatory Visit | Attending: Nurse Practitioner | Admitting: Nurse Practitioner

## 2020-08-14 DIAGNOSIS — J439 Emphysema, unspecified: Secondary | ICD-10-CM | POA: Insufficient documentation

## 2020-08-14 DIAGNOSIS — I2510 Atherosclerotic heart disease of native coronary artery without angina pectoris: Secondary | ICD-10-CM | POA: Diagnosis not present

## 2020-08-14 DIAGNOSIS — Z87891 Personal history of nicotine dependence: Secondary | ICD-10-CM

## 2020-08-14 DIAGNOSIS — Z122 Encounter for screening for malignant neoplasm of respiratory organs: Secondary | ICD-10-CM | POA: Diagnosis not present

## 2020-08-14 DIAGNOSIS — I700 Atherosclerosis of aorta: Secondary | ICD-10-CM | POA: Diagnosis not present

## 2020-08-16 ENCOUNTER — Encounter: Payer: Self-pay | Admitting: *Deleted

## 2021-06-16 IMAGING — CT CT CHEST LUNG CANCER SCREENING LOW DOSE W/O CM
2 of 5 series · 15 of 40 positions shown, 18 images · non-contrast
Comparison: 04/16/2019 screening chest CT.

CLINICAL DATA: 79-year-old asymptomatic female current smoker with
42.25 pack-year smoking history.

EXAM:
CT CHEST WITHOUT CONTRAST LOW-DOSE FOR LUNG CANCER SCREENING
TECHNIQUE: Multidetector CT imaging of the chest was performed following the
standard protocol without IV contrast.

[Series 3: lung 1.00 · axial · 0.57mm/px · z∈[-1074,-773]mm · 12 of 333 slices shown, 15 images]
[im 16/333  mediastinal]
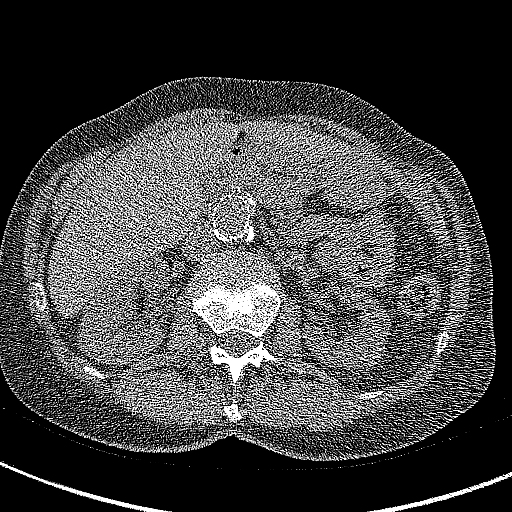
[im 16/333  lung]
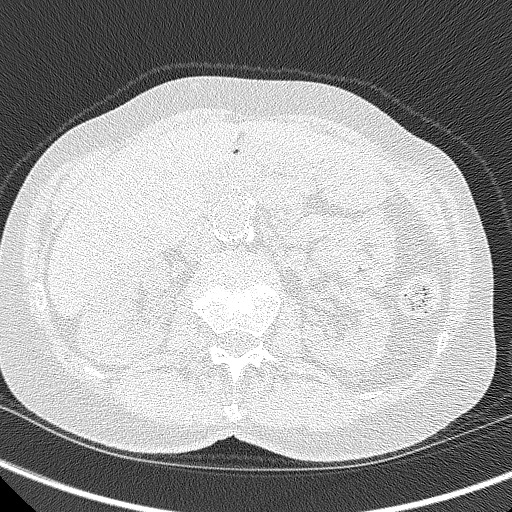
[im 46/333  lung]
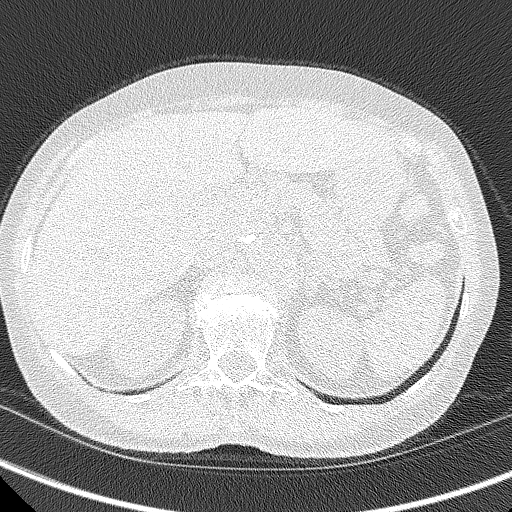
[im 76/333  lung]
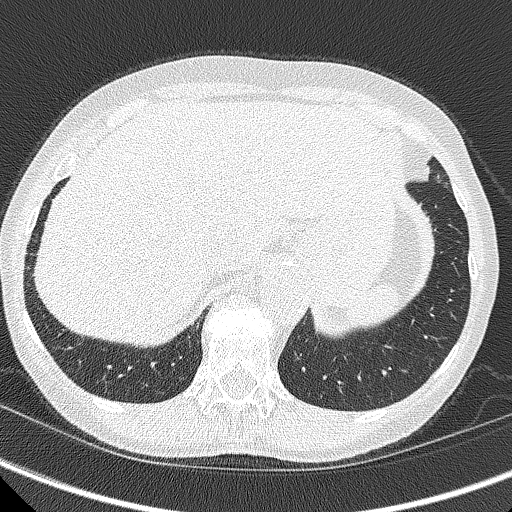
[im 106/333  lung]
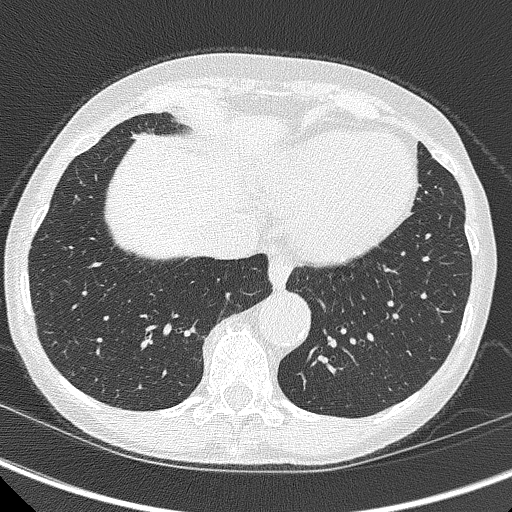
[im 121/333  mediastinal]
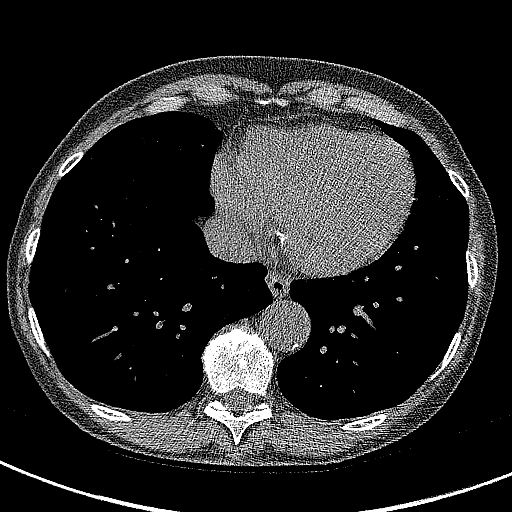
[im 121/333  lung]
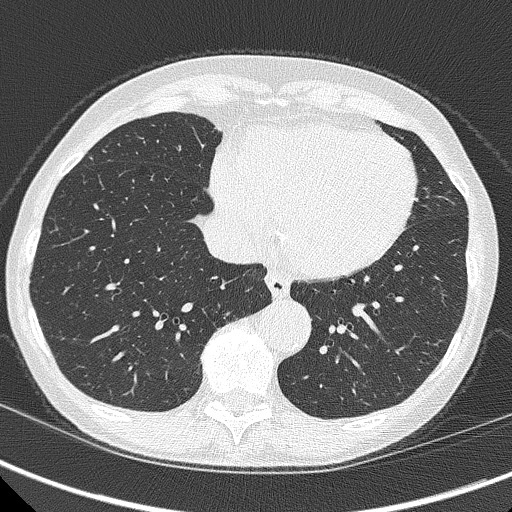
[im 151/333  lung]
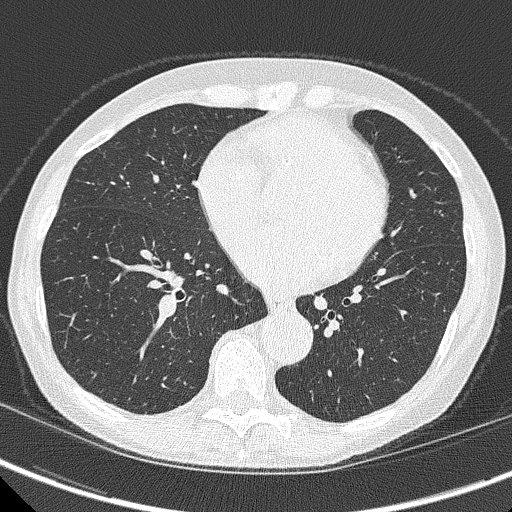
[im 182/333  lung]
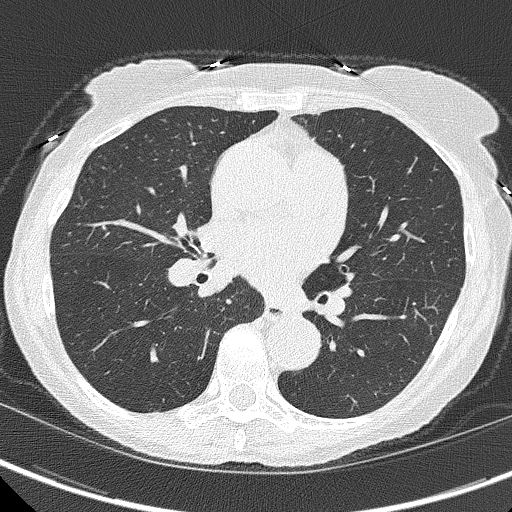
[im 212/333  lung]
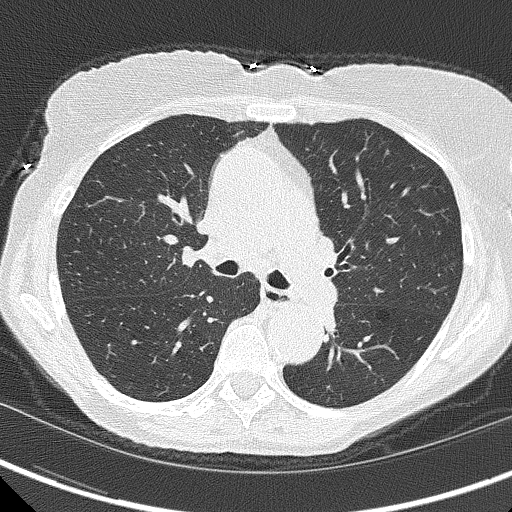
[im 227/333  mediastinal]
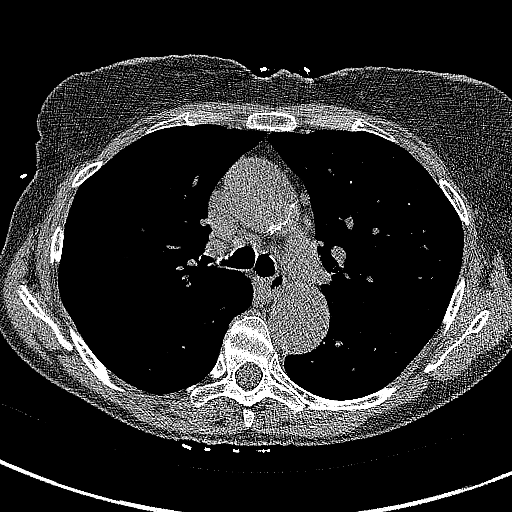
[im 227/333  lung]
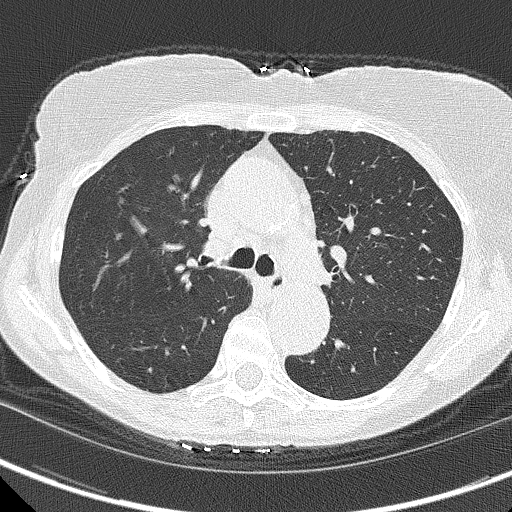
[im 257/333  lung]
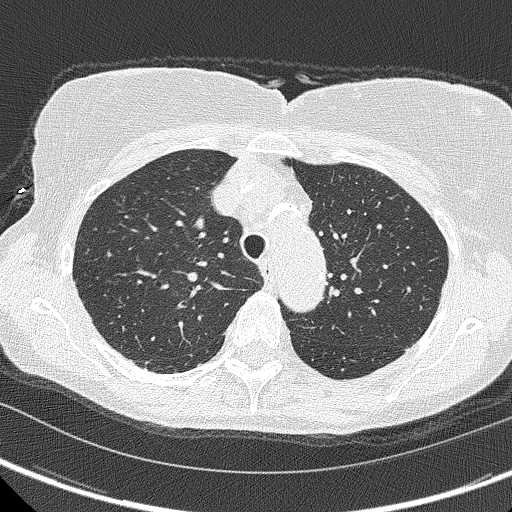
[im 287/333  lung]
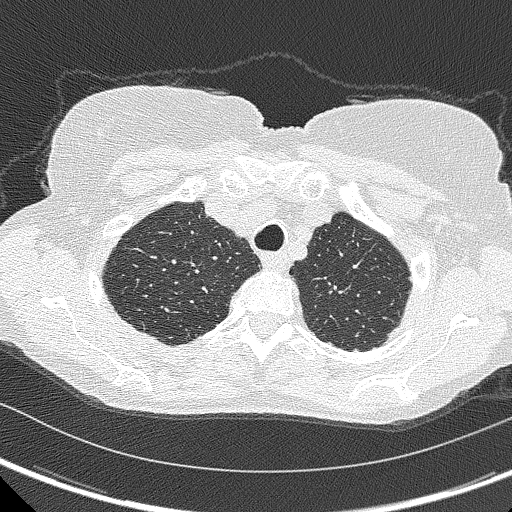
[im 317/333  lung]
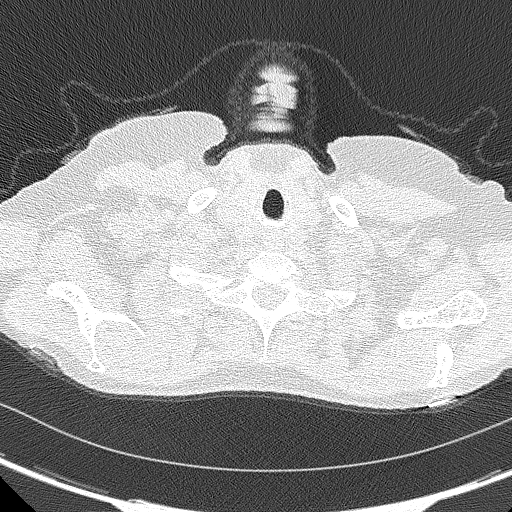

[Series 4: coronals lung 1.00 cor · coronal · 0.57mm/px · 3 of 234 slices shown]
[im 47/234  lung]
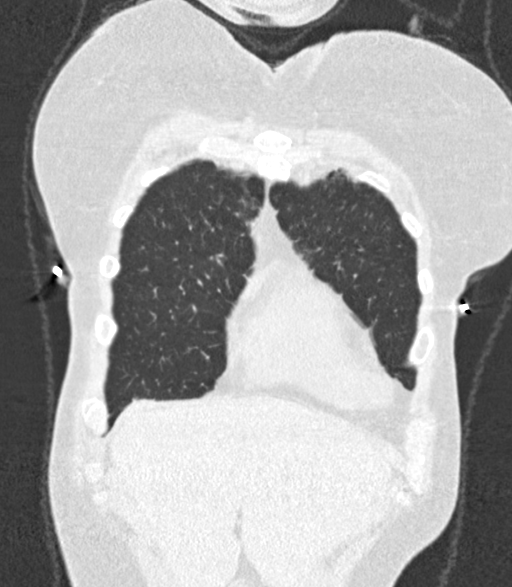
[im 94/234  lung]
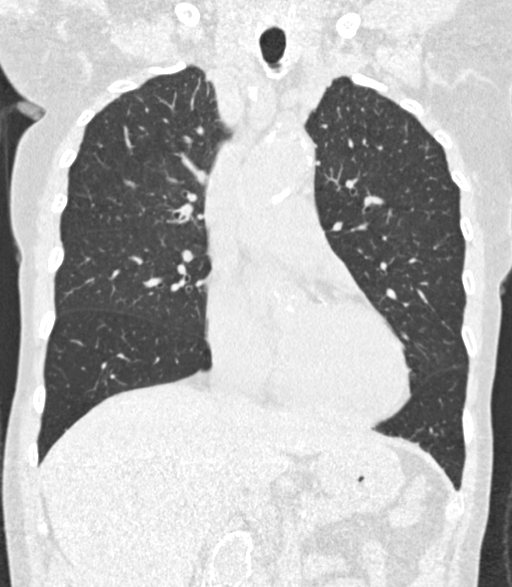
[im 140/234  lung]
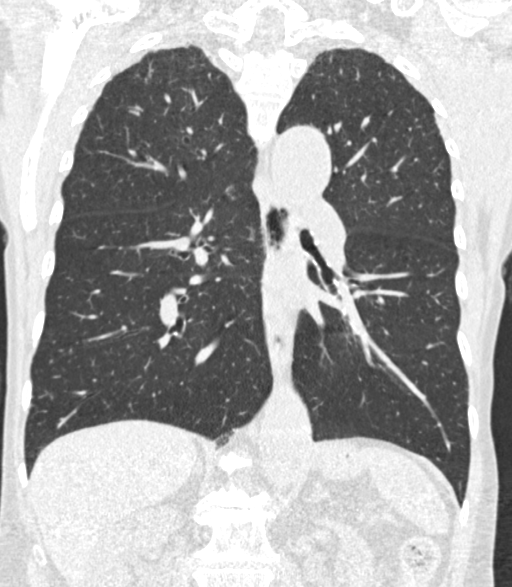

[15 of 40 positions shown; findings below may reference images not displayed]

FINDINGS: Cardiovascular: Normal heart size. No significant pericardial
effusion/thickening. Three-vessel coronary atherosclerosis.
Atherosclerotic nonaneurysmal thoracic aorta. Stable top-normal
caliber main pulmonary artery (3.2 cm diameter).

Mediastinum/Nodes: No discrete thyroid nodules. Unremarkable
esophagus. No pathologically enlarged axillary, mediastinal or hilar
lymph nodes, noting limited sensitivity for the detection of hilar
adenopathy on this noncontrast study.

Lungs/Pleura: No pneumothorax. No pleural effusion. Mild
centrilobular emphysema with mild diffuse bronchial wall thickening.
No acute consolidative airspace disease or lung masses. No
significant growth of previously visualized scattered pulmonary
nodules. No new significant pulmonary nodules.

Upper abdomen: No acute abnormality.

Musculoskeletal: No aggressive appearing focal osseous lesions.
Marked thoracic spondylosis.
IMPRESSION: 1. Lung-RADS 2, benign appearance or behavior. Continue annual
screening with low-dose chest CT without contrast in 12 months.
2. Three-vessel coronary atherosclerosis.
3. Aortic Atherosclerosis (K2IOQ-0JX.X) and Emphysema ([id number].S).

## 2021-06-18 ENCOUNTER — Other Ambulatory Visit: Payer: Self-pay | Admitting: Family Medicine

## 2021-06-18 DIAGNOSIS — Z1231 Encounter for screening mammogram for malignant neoplasm of breast: Secondary | ICD-10-CM

## 2021-08-07 ENCOUNTER — Ambulatory Visit
Admission: RE | Admit: 2021-08-07 | Discharge: 2021-08-07 | Disposition: A | Discharge status: Home / Self Care | Payer: Medicare Other | Source: Ambulatory Visit | Attending: Family Medicine | Admitting: Family Medicine

## 2021-08-07 ENCOUNTER — Other Ambulatory Visit: Payer: Self-pay

## 2021-08-07 DIAGNOSIS — Z1231 Encounter for screening mammogram for malignant neoplasm of breast: Secondary | ICD-10-CM | POA: Diagnosis present

## 2022-05-16 ENCOUNTER — Other Ambulatory Visit: Payer: Self-pay

## 2022-05-16 ENCOUNTER — Encounter: Payer: Self-pay | Admitting: Emergency Medicine

## 2022-05-16 ENCOUNTER — Emergency Department: Payer: Medicare Other

## 2022-05-16 ENCOUNTER — Inpatient Hospital Stay
Admission: EM | Admit: 2022-05-16 | Discharge: 2022-05-18 | DRG: 193 | Disposition: A | Discharge status: Home Health | Payer: Medicare Other | Attending: Internal Medicine | Admitting: Internal Medicine

## 2022-05-16 DIAGNOSIS — Z96652 Presence of left artificial knee joint: Secondary | ICD-10-CM | POA: Diagnosis present

## 2022-05-16 DIAGNOSIS — F17210 Nicotine dependence, cigarettes, uncomplicated: Secondary | ICD-10-CM | POA: Diagnosis present

## 2022-05-16 DIAGNOSIS — K219 Gastro-esophageal reflux disease without esophagitis: Secondary | ICD-10-CM | POA: Diagnosis present

## 2022-05-16 DIAGNOSIS — R0602 Shortness of breath: Secondary | ICD-10-CM | POA: Diagnosis not present

## 2022-05-16 DIAGNOSIS — Z8041 Family history of malignant neoplasm of ovary: Secondary | ICD-10-CM

## 2022-05-16 DIAGNOSIS — J159 Unspecified bacterial pneumonia: Secondary | ICD-10-CM | POA: Diagnosis not present

## 2022-05-16 DIAGNOSIS — Z881 Allergy status to other antibiotic agents status: Secondary | ICD-10-CM

## 2022-05-16 DIAGNOSIS — Z79899 Other long term (current) drug therapy: Secondary | ICD-10-CM

## 2022-05-16 DIAGNOSIS — Z66 Do not resuscitate: Secondary | ICD-10-CM | POA: Diagnosis present

## 2022-05-16 DIAGNOSIS — Z8673 Personal history of transient ischemic attack (TIA), and cerebral infarction without residual deficits: Secondary | ICD-10-CM

## 2022-05-16 DIAGNOSIS — E7800 Pure hypercholesterolemia, unspecified: Secondary | ICD-10-CM | POA: Diagnosis present

## 2022-05-16 DIAGNOSIS — J9601 Acute respiratory failure with hypoxia: Secondary | ICD-10-CM | POA: Diagnosis present

## 2022-05-16 DIAGNOSIS — I248 Other forms of acute ischemic heart disease: Secondary | ICD-10-CM | POA: Diagnosis present

## 2022-05-16 DIAGNOSIS — E876 Hypokalemia: Secondary | ICD-10-CM | POA: Diagnosis present

## 2022-05-16 DIAGNOSIS — Z7982 Long term (current) use of aspirin: Secondary | ICD-10-CM

## 2022-05-16 DIAGNOSIS — E785 Hyperlipidemia, unspecified: Secondary | ICD-10-CM | POA: Diagnosis present

## 2022-05-16 DIAGNOSIS — Z20822 Contact with and (suspected) exposure to covid-19: Secondary | ICD-10-CM | POA: Diagnosis present

## 2022-05-16 DIAGNOSIS — I10 Essential (primary) hypertension: Secondary | ICD-10-CM | POA: Diagnosis present

## 2022-05-16 DIAGNOSIS — J189 Pneumonia, unspecified organism: Secondary | ICD-10-CM | POA: Diagnosis present

## 2022-05-16 DIAGNOSIS — R0902 Hypoxemia: Principal | ICD-10-CM

## 2022-05-16 DIAGNOSIS — Z800 Family history of malignant neoplasm of digestive organs: Secondary | ICD-10-CM

## 2022-05-16 LAB — TROPONIN I (HIGH SENSITIVITY)
Troponin I (High Sensitivity): 31 ng/L — ABNORMAL HIGH (ref <18)
Troponin I (High Sensitivity): 29 ng/L — ABNORMAL HIGH (ref <18)

## 2022-05-16 LAB — CBC
HCT: 39.9 % (ref 36.0–46.0)
Hemoglobin: 13.3 g/dL (ref 12.0–15.0)
MCH: 30.9 pg (ref 26.0–34.0)
MCHC: 33.3 g/dL (ref 30.0–36.0)
MCV: 92.8 fL (ref 80.0–100.0)
Platelets: 238 10*3/uL (ref 150–400)
RBC: 4.3 MIL/uL (ref 3.87–5.11)
RDW: 13.3 % (ref 11.5–15.5)
WBC: 10.3 10*3/uL (ref 4.0–10.5)
nRBC: 0 % (ref 0.0–0.2)

## 2022-05-16 LAB — BASIC METABOLIC PANEL
Anion gap: 13 (ref 5–15)
BUN: 21 mg/dL (ref 8–23)
CO2: 22 mmol/L (ref 22–32)
Calcium: 8.9 mg/dL (ref 8.9–10.3)
Chloride: 102 mmol/L (ref 98–111)
Creatinine, Ser: 0.87 mg/dL (ref 0.44–1.00)
GFR, Estimated: >60 mL/min (ref >60)
Glucose, Bld: 121 mg/dL — ABNORMAL HIGH (ref 70–99)
Potassium: 3.4 mmol/L — ABNORMAL LOW (ref 3.5–5.1)
Sodium: 137 mmol/L (ref 135–145)

## 2022-05-16 LAB — RESP PANEL BY RT-PCR (FLU A&B, COVID) ARPGX2
Influenza A by PCR: NEGATIVE
Influenza B by PCR: NEGATIVE
SARS Coronavirus 2 by RT PCR: NEGATIVE

## 2022-05-16 LAB — PROCALCITONIN: Procalcitonin: 0.18 ng/mL

## 2022-05-16 MED ORDER — PAROXETINE HCL 20 MG PO TABS
40.0000 mg | ORAL_TABLET | Freq: Every day | ORAL | Status: DC
  Administered 2022-05-17 – 2022-05-18 (×2): 40 mg via ORAL
  Filled 2022-05-16 (×2): qty 2

## 2022-05-16 MED ORDER — FLUTICASONE PROPIONATE 50 MCG/ACT NA SUSP: 2.0000 | Freq: Every day | NASAL | Status: DC | PRN

## 2022-05-16 MED ORDER — POTASSIUM CHLORIDE CRYS ER 20 MEQ PO TBCR
40.0000 meq | EXTENDED_RELEASE_TABLET | Freq: Once | ORAL | Status: AC
  Administered 2022-05-16: 40 meq via ORAL
  Filled 2022-05-16: qty 2

## 2022-05-16 MED ORDER — BENZONATATE 100 MG PO CAPS
100.0000 mg | ORAL_CAPSULE | Freq: Two times a day (BID) | ORAL | Status: DC | PRN
Start: 2022-05-16 — End: 2022-05-18
  Administered 2022-05-17: 100 mg via ORAL
  Filled 2022-05-16: qty 1

## 2022-05-16 MED ORDER — TRAZODONE HCL 50 MG PO TABS
25.0000 mg | ORAL_TABLET | Freq: Every evening | ORAL | Status: DC | PRN
  Administered 2022-05-16: 25 mg via ORAL
  Filled 2022-05-16: qty 1

## 2022-05-16 MED ORDER — GUAIFENESIN-DM 100-10 MG/5ML PO SYRP
5.0000 mL | ORAL_SOLUTION | ORAL | Status: DC | PRN
  Administered 2022-05-18: 5 mL via ORAL
  Filled 2022-05-16: qty 10

## 2022-05-16 MED ORDER — SODIUM CHLORIDE 0.9 % IV SOLN
100.0000 mg | Freq: Two times a day (BID) | INTRAVENOUS | Status: DC
  Administered 2022-05-16 – 2022-05-18 (×4): 100 mg via INTRAVENOUS
  Filled 2022-05-16 (×5): qty 100

## 2022-05-16 MED ORDER — ENOXAPARIN SODIUM 40 MG/0.4ML IJ SOSY
40.0000 mg | PREFILLED_SYRINGE | INTRAMUSCULAR | Status: DC
Start: 2022-05-16 — End: 2022-05-18
  Administered 2022-05-16 – 2022-05-17 (×2): 40 mg via SUBCUTANEOUS
  Filled 2022-05-16 (×2): qty 0.4

## 2022-05-16 MED ORDER — PAROXETINE HCL 20 MG PO TABS: 20.0000 mg | ORAL_TABLET | Freq: Every day | ORAL | Status: DC

## 2022-05-16 MED ORDER — LOSARTAN POTASSIUM 50 MG PO TABS
50.0000 mg | ORAL_TABLET | Freq: Every day | ORAL | Status: DC
  Administered 2022-05-17 – 2022-05-18 (×2): 50 mg via ORAL
  Filled 2022-05-16 (×2): qty 1

## 2022-05-16 MED ORDER — SODIUM CHLORIDE 0.9 % IV SOLN
2.0000 g | INTRAVENOUS | Status: DC
  Administered 2022-05-17 (×2): 2 g via INTRAVENOUS
  Filled 2022-05-16: qty 20
  Filled 2022-05-16: qty 2

## 2022-05-16 MED ORDER — PANTOPRAZOLE SODIUM 40 MG PO TBEC
40.0000 mg | DELAYED_RELEASE_TABLET | Freq: Every day | ORAL | Status: DC
  Administered 2022-05-16 – 2022-05-18 (×3): 40 mg via ORAL
  Filled 2022-05-16 (×3): qty 1

## 2022-05-16 MED ORDER — ATORVASTATIN CALCIUM 20 MG PO TABS: 40.0000 mg | ORAL_TABLET | Freq: Every day | ORAL | Status: DC

## 2022-05-16 MED ORDER — POLYETHYLENE GLYCOL 3350 17 G PO PACK: 17.0000 g | PACK | Freq: Every day | ORAL | Status: DC | PRN

## 2022-05-16 MED ORDER — SODIUM CHLORIDE 0.9 % IV SOLN: INTRAVENOUS | Status: AC

## 2022-05-16 MED ORDER — ACETAMINOPHEN 325 MG PO TABS: 650.0000 mg | ORAL_TABLET | Freq: Four times a day (QID) | ORAL | Status: DC | PRN

## 2022-05-16 MED ORDER — SODIUM CHLORIDE 0.9 % IV BOLUS
1000.0000 mL | Freq: Once | INTRAVENOUS | Status: AC
  Administered 2022-05-16: 1000 mL via INTRAVENOUS

## 2022-05-16 MED ORDER — ACETAMINOPHEN 650 MG RE SUPP: 650.0000 mg | Freq: Four times a day (QID) | RECTAL | Status: DC | PRN

## 2022-05-16 MED ORDER — SODIUM CHLORIDE 0.9 % IV SOLN
1.0000 g | Freq: Once | INTRAVENOUS | Status: AC
  Administered 2022-05-16: 1 g via INTRAVENOUS
  Filled 2022-05-16: qty 10

## 2022-05-16 MED ORDER — IOHEXOL 350 MG/ML SOLN
75.0000 mL | Freq: Once | INTRAVENOUS | Status: AC | PRN
  Administered 2022-05-16: 75 mL via INTRAVENOUS

## 2022-05-16 MED ORDER — ASPIRIN 81 MG PO TBEC
81.0000 mg | DELAYED_RELEASE_TABLET | Freq: Every day | ORAL | Status: DC
  Administered 2022-05-17 – 2022-05-18 (×2): 81 mg via ORAL
  Filled 2022-05-16 (×2): qty 1

## 2022-05-16 NOTE — ED Triage Notes (Signed)
Pt reports recent travel to [place] and when she came home she wasn't feeling well. Pt reports fever, CP, SOB, weakness and overall doesn't feel well. Pt reports she called Dr Mayo Ao Wednesday and he called her in an ABX, pt reports started taking it but is still not feeling bettter.

## 2022-05-16 NOTE — ED Notes (Signed)
Advised nurse that patient has ready bed 

## 2022-05-16 NOTE — H&P (Signed)
History and Physical    Joann Thomas:[id number] DOB: 03/11/1941 DOA: 05/16/2022  PCP: Joann Baseman, MD  Patient coming from: Home Chief Complaint: cough, fever  HPI: Joann Thomas is a 81 y.o. female with medical history significant of HTN. HLD, TIA, tobacco use, GERD, chronic cough who presents for fever, cough and SOB.  [name] reports that she has a chronic cough all the time, however, she had an acute worsening of the cough prior to going to [place] to visit family.  Upon arrival there, she was in contact with a sick grandchild.  Upon returning, she noted worsening cough, SOB, fever to 102F, chest pain with coughing, weakness and fatigue.  She has had decreased PO intake including fluids and food.  She also has dry mouth.  She has had generalized weakness and was disoriented this morning, but this has cleared up.  She has continued smoking.  Her husband notes she is a "closeted" smoker and does not do this around anyone.  Reportedly she was given an antibiotic, but she does not know what it was.  She reports taking amoxil for GERD, but I see only an Rx for amoxicillin prior to dental procedures.   ED Course: In the ED, she was noted to have a K of 3.4, Her WBC is only 10.3.  However, her CXR showed possible pneumonia and follow up CT showed multifocal pneumonia as well.  She did not have a PE.  PCT is 0.18. COVID and influenza are negative.   Review of Systems: As per HPI otherwise all other systems reviewed and are negative.   Past Medical History:  Diagnosis Date   Anxiety    Chronic cough    Depression    GERD (gastroesophageal reflux disease)    Hypercholesterolemia    Hypertension     Past Surgical History:  Procedure Laterality Date   BREAST EXCISIONAL BIOPSY Left    age 51   CATARACT EXTRACTION W/PHACO Right 04/11/2017   Procedure: CATARACT EXTRACTION PHACO AND INTRAOCULAR LENS PLACEMENT (IOC)  right toric;  Surgeon: Lockie Mola, MD;  Location: [facility];  Service: Ophthalmology;  Laterality: Right;  Toric Lens   CATARACT EXTRACTION W/PHACO Left 05/18/2017   Procedure: CATARACT EXTRACTION PHACO AND INTRAOCULAR LENS PLACEMENT (IOC);  Surgeon: Lockie Mola, MD;  Location: [facility] SURGERY [facility];  Service: Ophthalmology;  Laterality: Left;  IVA TOPICAL LEFT TORIC   COLONOSCOPY     ESOPHAGOGASTRODUODENOSCOPY (EGD) WITH PROPOFOL N/A 05/23/2017   Procedure: ESOPHAGOGASTRODUODENOSCOPY (EGD) WITH PROPOFOL;  Surgeon: Joann Jun, MD;  Location: [facility] ENDOSCOPY;  Service: Endoscopy;  Laterality: N/A;   REPLACEMENT TOTAL KNEE Left 2011   RIGHT HEART CATH Right 06/23/2017   Procedure: RIGHT HEART CATH;  Surgeon: Joann Millard, MD;  Location: [facility] INVASIVE CV LAB;  Service: Cardiovascular;  Laterality: Right;   VARICOSE VEIN SURGERY Right 1973    Social History  reports that she has been smoking cigarettes. She has a 41.25 pack-year smoking history. She has never used smokeless tobacco. She reports current alcohol use of about 1.0 standard drink of alcohol per week. She reports that she does not use drugs.  Allergies  Allergen Reactions   Green Dye Other (See Comments)    Patient has allergy to hair dye and develops whelps when in contact.  Patient has allergy to hair dye and develops whelps when in contact.  Patient has allergy to hair dye and develops whelps when in contact.     Other Other (  See Comments)    Hair dye- causes whelps on scalp   Zithromax [Azithromycin] Rash    Rash on torso that looks like a burn    Family History  Problem Relation Age of Onset   Pancreatic cancer Father 38   Ovarian cancer Paternal Aunt    Breast cancer Neg Hx     Prior to Admission medications   Medication Sig Start Date End Date Taking? Authorizing Provider         amoxicillin (AMOXIL) 500 MG capsule Take 2,000 mg by mouth See admin instructions. Take 4 capsules one hour before dental procedure     [provider]   aspirin EC 81 MG EC tablet Take 1 tablet (81 mg total) by mouth daily. 06/01/16   Joann Caper, MD  atorvastatin (LIPITOR) 40 MG tablet Take 1 tablet (40 mg total) by mouth daily at 6 PM. 06/01/16   Joann Caper, MD  calcium carbonate (CALCIUM 600) 600 MG TABS tablet Take 600 mg by mouth 2 (two) times daily with a meal.    [provider]  Cholecalciferol (VITAMIN D3) 2000 units TABS Take 2,000 Units by mouth daily.    [provider]  fluticasone (FLONASE) 50 MCG/ACT nasal spray Place 2 sprays into both nostrils daily.    [provider]  losartan (COZAAR) 50 MG tablet Take 50 mg by mouth daily.    [provider]  omeprazole (PRILOSEC) 20 MG capsule Take 20 mg by mouth daily.    [provider]  PARoxetine (PAXIL) 20 MG tablet Take 20 mg by mouth daily. 04/28/17   [provider]                  Physical Exam: Vitals:   05/16/22 1630 05/16/22 1700 05/16/22 1736 05/16/22 1800  BP:  (!) 115/101 (!) 145/73 (!) 160/63  Pulse: 66 74 79 65  Resp: (!) 23 17 17  (!) 21  Temp:   98.1 F (36.7 C)   TempSrc:   Oral   SpO2: 91% 96% 94% 96%  Weight:      Height:        Constitutional: NAD, calm, comfortable, wearing oxygen Eyes:  lids and conjunctivae normal ENMT: Mucous membranes are dry, she has whitish sputum on the posterior throat which is thick, some whitish material which moves with swallowing, no obvious thrush.  Neck: normal, supple, Respiratory: Relatively normal with some mild rales at mid lung on the right. No wheezing Cardiovascular: Regular rate and rhythm, no murmur, no gallop.  She has no pedal edema.  Pulses intact in radial and DP pulses bilaterally.  Abdomen: +BS, soft,  Musculoskeletal: no clubbing / cyanosis. Normal tone and bulk for age.  Skin: no rashes, lesions, ulcers on exposed skin. She does have tanning and varicosities Neurologic: Grossly intact, oriented to person, place, time.  Psychiatric: Normal  judgment and insight. Alert and oriented x 3. Normal mood.   Labs on Admission: I have personally reviewed following labs and imaging studies  CBC: Recent Labs  Lab 05/16/22 1351  WBC 10.3  HGB 13.3  HCT 39.9  MCV 92.8  PLT 238    Basic Metabolic Panel: Recent Labs  Lab 05/16/22 1351  NA 137  K 3.4*  CL 102  CO2 22  GLUCOSE 121*  BUN 21  CREATININE 0.87  CALCIUM 8.9    GFR: Estimated Creatinine Clearance: 44.9 mL/min (by C-G formula based on SCr of 0.87 mg/dL).  Liver Function Tests: No results for  input(s): "AST", "ALT", "ALKPHOS", "BILITOT", "PROT", "ALBUMIN" in the last 168 hours.  Urine analysis: No results found for: "COLORURINE", "APPEARANCEUR", "LABSPEC", "PHURINE", "GLUCOSEU", "HGBUR", "BILIRUBINUR", "KETONESUR", "PROTEINUR", "UROBILINOGEN", "NITRITE", "LEUKOCYTESUR"  Radiological Exams on Admission: CT Angio Chest PE W and/or Wo Contrast  Result Date: 05/16/2022 CLINICAL DATA:  Pulmonary embolism (PE) suspected, high prob EXAM: CT ANGIOGRAPHY CHEST WITH CONTRAST TECHNIQUE: Multidetector CT imaging of the chest was performed using the standard protocol during bolus administration of intravenous contrast. Multiplanar CT image reconstructions and MIPs were obtained to evaluate the vascular anatomy. RADIATION DOSE REDUCTION: This exam was performed according to the departmental dose-optimization program which includes automated exposure control, adjustment of the mA and/or kV according to patient size and/or use of iterative reconstruction technique. CONTRAST:  105mL OMNIPAQUE IOHEXOL 350 MG/ML SOLN COMPARISON:  CT dated August 14, 2020. FINDINGS: Cardiovascular: Satisfactory opacification of the pulmonary arteries to the segmental level. Limited evaluation of the bases secondary to respiratory motion. No evidence of pulmonary embolism. Heart is the upper limits of normal. No pericardial effusion. Three-vessel coronary artery atherosclerotic calcifications.  Atherosclerotic calcifications throughout the nonaneurysmal thoracic aorta. Mediastinum/Nodes: Visualized thyroid is unremarkable. No axillary adenopathy. Mildly increased size of mediastinal lymph nodes with representative pretracheal lymph node measuring 12 mm in the short axis, previously 8 mm (series 4, image 53). Lungs/Pleura: No pleural effusion or pneumothorax. New peripheral centrilobular ground-glass opacities most predominantly at the RIGHT lung base and bilateral apices. New small hypoenhancing consolidative opacities of the RIGHT lung base, peripheral RIGHT middle lobe, posterior LEFT lower lobe, and lingula. Mild centrilobular emphysema. Mild bronchial wall thickening of the RIGHT lower and upper lobe. Upper Abdomen: No acute abnormality. Musculoskeletal: Degenerative changes of the thoracolumbar spine. Review of the MIP images confirms the above findings. IMPRESSION: 1. No acute pulmonary embolism. 2. New scattered bilateral ground-glass and consolidative opacities, most consistent with multifocal infection. 3. Increase in size of multiple mediastinal lymph nodes, likely reactive. Aortic Atherosclerosis (ICD10-I70.0) and Emphysema (ICD10-J43.9). Electronically Signed   By: Valentino Saxon M.D.   On: 05/16/2022 17:13   DG Chest 2 View  Result Date: 05/16/2022 CLINICAL DATA:  Productive cough. EXAM: CHEST - 2 VIEW COMPARISON:  Chest CT August 14, 2020 FINDINGS: Tortuosity and calcific atherosclerotic disease of the aorta. Cardiomediastinal silhouette is normal. Mediastinal contours appear intact. There is no evidence of focal airspace consolidation, pleural effusion or pneumothorax. Diffuse coarsening of the interstitial markings. Osseous structures are without acute abnormality. Soft tissues are grossly normal. IMPRESSION: 1. Diffuse coarsening of the interstitial markings. This may be due to peribronchial consolidation, bronchitis or atypical pneumonia. 2. No evidence of lobar consolidation.  Electronically Signed   By: Fidela Salisbury M.D.   On: 05/16/2022 14:15    EKG: Independently reviewed. Wide QRS, possible IVC issues, possible early LBBB.  Changed from last EKG in 2018.  Will repeat in the AM   Assessment/Plan Community acquired bacterial pneumonia Chronic cough - She is allergic to azithromycin, Rocephin and doxycycline started - Urinary antigen for strep - Sputum and blood cultures, follow up - Cough medication ordered - robitussin, tessalon - Oxygen as needed - COVID and flu neg - RVP pending  EKG changes (from 2018) - Repeat EKG in the AM - TnI are flat at 31 and 29 - She has pleuritic chest pain, atypical  Hypokalemia - replace orally - Recheck in the AM  Hyperlipidemia - Continue home atorvastatin    Essential hypertension - Continue home losartan, blood pressure is on the high  side  GERD - Protonix ordered  Tobacco use - Reports not needing nicotine replacement - Will need counseling during hospitalization   DVT prophylaxis: Lovenox  Code Status:   DNR (confirmed with patient)  Family Communication:  Spouse at bedside  Disposition Plan:   Patient is from:  Home  Anticipated DC to:  Home  Anticipated DC date:  05/17/22  Anticipated DC barriers: Improvement in medical situation  Consults called:  None  Admission status:  Obs, med surg   Severity of Illness: The appropriate patient status for this patient is OBSERVATION. Observation status is judged to be reasonable and necessary in order to provide the required intensity of service to ensure the patient's safety. The patient's presenting symptoms, physical exam findings, and initial radiographic and laboratory data in the context of their medical condition is felt to place them at decreased risk for further clinical deterioration. Furthermore, it is anticipated that the patient will be medically stable for discharge from the hospital within 2 midnights of admission.     Gilles Chiquito  MD [facility]  How to contact the [facility] Attending or Consulting provider [place] or covering provider during after hours [place], for this patient?   Check the care team in [facility] and look for a) attending/consulting [facility] provider listed and b) the [facility] team listed Log into [email or web address] and use [facility] universal password to access. If you do not have the password, please contact the hospital operator. Locate the [facility] provider you are looking for under [facility] and page to a number that you can be directly reached. If you still have difficulty reaching the provider, please page the [facility] (Director on Call) for the Hospitalists listed on amion for assistance.  05/16/2022, 6:49 PM

## 2022-05-16 NOTE — ED Notes (Addendum)
While in pt room, pt's oxygen saturation noted to be 86-88% on RA. Pt with good waveform on monitor. No acute distress noted. Pt placed on 2 lpm and improved to 91%. Pt upped to 4 lpm and now resting comfortably at 94-95% oxygen saturation. MD notified

## 2022-05-16 NOTE — ED Provider Notes (Signed)
[facility] Provider Note    Event Date/Time   First MD Initiated Contact with Patient 05/16/22 1356     (approximate)   History   Fever, Weakness, Shortness of Breath, and Chest Pain   HPI  Joann Thomas is a 81 y.o. female with past medical history of hypertension hyperlipidemia GERD presents with cough fever and generalized weakness.  Patient was visiting family in [place] she returned on Tuesday and on Wednesday when she woke up she felt ill.  She endorses feeling febrile with chills nasal discharge productive cough and feeling short of breath.  She has generalized chest pain with coughing.  Has been essentially just sleeping.  Has had poor appetite and not eating very much.  Her husband notes that this morning she seemed somewhat more disoriented and asked him if they were in their home which is unusual for her.  She denies any abdominal pain nausea vomiting or diarrhea.  She is not have any chest pain when she is not coughing.  No history of DVT/PE.  She did have a family member who had a respiratory illness.     Past Medical History:  Diagnosis Date   Anxiety    Chronic cough    Depression    GERD (gastroesophageal reflux disease)    Hypercholesterolemia    Hypertension     Patient Active Problem List   Diagnosis Date Noted   Pulmonary arterial hypertension (HCC) 07/07/2017   Hyperlipidemia 06/01/2016   Essential hypertension 06/01/2016   Tobacco abuse counseling 06/01/2016   TIA (transient ischemic attack) 05/31/2016     Physical Exam  Triage Vital Signs: ED Triage Vitals  Enc Vitals Group     BP 05/16/22 1336 (!) 142/55     Pulse Rate 05/16/22 1336 81     Resp 05/16/22 1336 20     Temp 05/16/22 1336 98.4 F (36.9 C)     Temp Source 05/16/22 1336 Oral     SpO2 05/16/22 1336 95 %     Weight 05/16/22 1334 130 lb (59 kg)     Height 05/16/22 1334 5\' 5"  (1.651 m)     Head Circumference --      Peak Flow --      Pain Score 05/16/22  1334 7     Pain Loc --      Pain Edu? --      Excl. in GC? --     Most recent vital signs: Vitals:   05/16/22 1530 05/16/22 1537  BP:  (!) 155/70  Pulse: 68 66  Resp: (!) 24 20  Temp:    SpO2: 93% 98%     General: Awake, no distress.  CV:  Good peripheral perfusion. No edema Resp:  Normal effort.  Intermittent expiratory wheezing that resolves with coughing Abd:  No distention.  Neuro:             Awake, Alert, Oriented x 3  Other:     ED Results / Procedures / Treatments  Labs (all labs ordered are listed, but only abnormal results are displayed) Labs Reviewed  BASIC METABOLIC PANEL - Abnormal; Notable for the following components:      Result Value   Potassium 3.4 (*)    Glucose, Bld 121 (*)    All other components within normal limits  TROPONIN I (HIGH SENSITIVITY) - Abnormal; Notable for the following components:   Troponin I (High Sensitivity) 31 (*)    All other components within normal  limits  RESP PANEL BY RT-PCR (FLU A&B, COVID) [id number]  RESPIRATORY PANEL BY PCR  CBC  URINALYSIS, ROUTINE W REFLEX MICROSCOPIC  PROCALCITONIN  TROPONIN I (HIGH SENSITIVITY)     EKG  EKG interpreted by myself shows LVH with intraventricular conduction delay versus left bundle branch block, no acute ischemic change RADIOLOGY Chest x-ray interpreted myself shows coarse interstitial markings no lobar infiltrate   PROCEDURES:  Critical Care performed: Yes, see critical care procedure note(s)  .1-3 Lead EKG Interpretation  Performed by: Georga Hacking, MD Authorized by: Georga Hacking, MD     Interpretation: normal     ECG rate assessment: normal     Rhythm: sinus rhythm     Ectopy: none     Conduction: normal     The patient is on the cardiac monitor to evaluate for evidence of arrhythmia and/or significant heart rate changes.   MEDICATIONS ORDERED IN ED: Medications  sodium chloride 0.9 % bolus 1,000 mL (0 mLs Intravenous Stopped 05/16/22 1542)      IMPRESSION / MDM / ASSESSMENT AND PLAN / ED COURSE  I reviewed the triage vital signs and the nursing notes.                              Patient's presentation is most consistent with acute presentation with potential threat to life or bodily function.  Differential diagnosis includes, but is not limited to, viral illness including COVID-19, influenza, pneumonia, sepsis, pulmonary embolism, less likely acute coronary syndrome  The patient is an 81 year old female is relatively healthy presents with cough decreased appetite fever feeling generally unwell since returning from a trip to [place] on Wednesday.  Patient is hypoxic requiring 4 L nasal cannula here.  She overall looks well she is alert and oriented nontoxic does have some expiratory wheezing on lung exam does not appear volume overloaded.  Chest x-ray has some nonspecific increased interstitial markings potentially consistent with an atypical pneumonia.  Labs are otherwise reassuring except for mildly elevated troponin to 31.  EKG does not show any ischemic change patient does have some pain with coughing but no ischemic sounding chest pain.  Will repeat.  Given the significant hypoxia will obtain a CTA to both rule out PE and further evaluate the lung parenchyma.  I have ordered COVID swab as well as an extended respiratory viral panel.  Patient signed out to oncoming provider pending CTA and admission.       FINAL CLINICAL IMPRESSION(S) / ED DIAGNOSES   Final diagnoses:  Hypoxia     Rx / DC Orders   ED Discharge Orders     None        Note:  This document was prepared using Dragon voice recognition software and may include unintentional dictation errors.   Georga Hacking, MD 05/16/22 [phone]

## 2022-05-16 NOTE — ED Notes (Signed)
Received patient in room 13, patient is on 2L of 02 via nasal canulla saturating at 93%. Patient currently receiving doxycycline in her IV . No s/s of IV complications noted . Continuing monitoring . Patient has an admission bed . Pending purple man .

## 2022-05-17 ENCOUNTER — Encounter: Payer: Self-pay | Admitting: Internal Medicine

## 2022-05-17 DIAGNOSIS — E785 Hyperlipidemia, unspecified: Secondary | ICD-10-CM | POA: Diagnosis not present

## 2022-05-17 DIAGNOSIS — Z66 Do not resuscitate: Secondary | ICD-10-CM | POA: Diagnosis present

## 2022-05-17 DIAGNOSIS — Z79899 Other long term (current) drug therapy: Secondary | ICD-10-CM | POA: Diagnosis not present

## 2022-05-17 DIAGNOSIS — Z8041 Family history of malignant neoplasm of ovary: Secondary | ICD-10-CM | POA: Diagnosis not present

## 2022-05-17 DIAGNOSIS — J9601 Acute respiratory failure with hypoxia: Secondary | ICD-10-CM | POA: Diagnosis present

## 2022-05-17 DIAGNOSIS — I10 Essential (primary) hypertension: Secondary | ICD-10-CM

## 2022-05-17 DIAGNOSIS — F17210 Nicotine dependence, cigarettes, uncomplicated: Secondary | ICD-10-CM | POA: Diagnosis present

## 2022-05-17 DIAGNOSIS — J159 Unspecified bacterial pneumonia: Secondary | ICD-10-CM | POA: Diagnosis present

## 2022-05-17 DIAGNOSIS — J189 Pneumonia, unspecified organism: Secondary | ICD-10-CM | POA: Diagnosis present

## 2022-05-17 DIAGNOSIS — R0602 Shortness of breath: Secondary | ICD-10-CM | POA: Diagnosis present

## 2022-05-17 DIAGNOSIS — I248 Other forms of acute ischemic heart disease: Secondary | ICD-10-CM | POA: Diagnosis present

## 2022-05-17 DIAGNOSIS — E7800 Pure hypercholesterolemia, unspecified: Secondary | ICD-10-CM | POA: Diagnosis present

## 2022-05-17 DIAGNOSIS — K219 Gastro-esophageal reflux disease without esophagitis: Secondary | ICD-10-CM | POA: Diagnosis present

## 2022-05-17 DIAGNOSIS — Z881 Allergy status to other antibiotic agents status: Secondary | ICD-10-CM | POA: Diagnosis not present

## 2022-05-17 DIAGNOSIS — Z20822 Contact with and (suspected) exposure to covid-19: Secondary | ICD-10-CM | POA: Diagnosis present

## 2022-05-17 DIAGNOSIS — Z8673 Personal history of transient ischemic attack (TIA), and cerebral infarction without residual deficits: Secondary | ICD-10-CM | POA: Diagnosis not present

## 2022-05-17 DIAGNOSIS — E876 Hypokalemia: Secondary | ICD-10-CM | POA: Diagnosis present

## 2022-05-17 DIAGNOSIS — Z7982 Long term (current) use of aspirin: Secondary | ICD-10-CM | POA: Diagnosis not present

## 2022-05-17 DIAGNOSIS — Z800 Family history of malignant neoplasm of digestive organs: Secondary | ICD-10-CM | POA: Diagnosis not present

## 2022-05-17 DIAGNOSIS — Z96652 Presence of left artificial knee joint: Secondary | ICD-10-CM | POA: Diagnosis present

## 2022-05-17 LAB — URINALYSIS, ROUTINE W REFLEX MICROSCOPIC
Bilirubin Urine: NEGATIVE
Glucose, UA: NEGATIVE mg/dL
Hgb urine dipstick: NEGATIVE
Ketones, ur: NEGATIVE mg/dL
Leukocytes,Ua: NEGATIVE
Nitrite: NEGATIVE
Protein, ur: 30 mg/dL — AB
Specific Gravity, Urine: 1.019 (ref 1.005–1.030)
pH: 5 (ref 5.0–8.0)

## 2022-05-17 LAB — RESPIRATORY PANEL BY PCR

## 2022-05-17 LAB — BASIC METABOLIC PANEL
Anion gap: 5 (ref 5–15)
BUN: 15 mg/dL (ref 8–23)
CO2: 24 mmol/L (ref 22–32)
Calcium: 8 mg/dL — ABNORMAL LOW (ref 8.9–10.3)
Chloride: 110 mmol/L (ref 98–111)
Creatinine, Ser: 0.78 mg/dL (ref 0.44–1.00)
GFR, Estimated: >60 mL/min (ref >60)
Glucose, Bld: 108 mg/dL — ABNORMAL HIGH (ref 70–99)
Potassium: 3.7 mmol/L (ref 3.5–5.1)
Sodium: 139 mmol/L (ref 135–145)

## 2022-05-17 LAB — CBC
HCT: 33.6 % — ABNORMAL LOW (ref 36.0–46.0)
Hemoglobin: 11.3 g/dL — ABNORMAL LOW (ref 12.0–15.0)
MCH: 31.7 pg (ref 26.0–34.0)
MCHC: 33.6 g/dL (ref 30.0–36.0)
MCV: 94.1 fL (ref 80.0–100.0)
Platelets: 215 10*3/uL (ref 150–400)
RBC: 3.57 MIL/uL — ABNORMAL LOW (ref 3.87–5.11)
RDW: 13.4 % (ref 11.5–15.5)
WBC: 8.6 10*3/uL (ref 4.0–10.5)
nRBC: 0 % (ref 0.0–0.2)

## 2022-05-17 LAB — STREP PNEUMONIAE URINARY ANTIGEN: Strep Pneumo Urinary Antigen: NEGATIVE

## 2022-05-17 NOTE — TOC Initial Note (Signed)
Transition of Care [facility]) - Initial/Assessment Note    Patient Details  Name: Joann Thomas MRN: [id number] Date of Birth: 27-Nov-1940  Transition of Care [facility]) CM/SW Contact:    Truddie Hidden, RN Phone Number: 05/17/2022, 12:56 PM  Clinical Narrative:                  Transition of Care [facility]) Screening Note   Patient Details  Name: Joann Thomas Date of Birth: Nov 08, 1940   Transition of Care [facility]) CM/SW Contact:    Truddie Hidden, RN Phone Number: 05/17/2022, 12:57 PM    Transition of Care Department [facility]) has reviewed patient and no TOC needs have been identified at this time. We will continue to monitor patient advancement through interdisciplinary progression rounds. If new patient transition needs arise, please place a TOC consult.          Patient Goals and CMS Choice        Expected Discharge Plan and Services                                                Prior Living Arrangements/Services                       Activities of Daily Living Home Assistive Devices/Equipment: None ADL Screening (condition at time of admission) Patient's cognitive ability adequate to safely complete daily activities?: Yes Is the patient deaf or have difficulty hearing?: No Does the patient have difficulty seeing, even when wearing glasses/contacts?: No Does the patient have difficulty concentrating, remembering, or making decisions?: No Patient able to express need for assistance with ADLs?: Yes Does the patient have difficulty dressing or bathing?: Yes Independently performs ADLs?: No Communication: Independent Dressing (OT): Needs assistance Is this a change from baseline?: Change from baseline, expected to last <3days Grooming: Independent, Appropriate for developmental age Feeding: Independent Bathing: Needs assistance Is this a change from baseline?: Change from baseline, expected to last <3 days Toileting: Needs assistance Is this a change  from baseline?: Change from baseline, expected to last <3 days In/Out Bed: Needs assistance Is this a change from baseline?: Change from baseline, expected to last <3 days Walks in Home: Independent Does the patient have difficulty walking or climbing stairs?: Yes Weakness of Legs: Both Weakness of Arms/Hands: None  Permission Sought/Granted                  Emotional Assessment              Admission diagnosis:  Shortness of breath [R06.02] Hypoxia [R09.02] Community acquired bacterial pneumonia [J15.9] Pneumonia due to infectious organism, unspecified laterality, unspecified part of lung [J18.9] Patient Active Problem List   Diagnosis Date Noted   Acute respiratory failure with hypoxia (HCC) 05/17/2022   Community acquired bacterial pneumonia 05/16/2022   Pulmonary arterial hypertension (HCC) 07/07/2017   Hyperlipidemia 06/01/2016   Essential hypertension 06/01/2016   Tobacco abuse counseling 06/01/2016   TIA (transient ischemic attack) 05/31/2016   PCP:  Dorothey Baseman, MD Pharmacy:   [facility] [address], [place] - [address] [place] [address] Phone: [phone] Fax: [phone]     Social Determinants of Health (SDOH) Interventions    Readmission Risk Interventions     No data to display

## 2022-05-17 NOTE — Progress Notes (Addendum)
Progress Note    Joann LESPERANCE  Thomas:[id number] DOB: [birth year]  DOA: 05/16/2022 PCP: Dorothey Baseman, MD      Brief Narrative:    Medical records reviewed and are as summarized below:  Joann Thomas is a 81 y.o. female with medical history significant for hypertension, hyperlipidemia, TIA, GERD, tobacco use disorder, chronic cough, who presented to the hospital with worsening cough, pleuritic chest pain, fever and shortness of breath.  She said her cough started prior to going to [place] to visit her family.  She was in contact with a sick grandchild when she was in [place]. When she returned from the [place], she developed worsening cough as severe with pleuritic chest pain, shortness of breath and fever up to 102 F.  She was found to have multifocal pneumonia.  She was treated with empiric IV antibiotics.  She also had hypokalemia that was repleted.        Assessment/Plan:   Principal Problem:   Community acquired bacterial pneumonia Active Problems:   Hyperlipidemia   Essential hypertension   Acute respiratory failure with hypoxia (HCC)   Body mass index is 21.63 kg/m.  Community-acquired multifocal pneumonia: Continue empiric IV ceftriaxone and azithromycin.  Follow-up blood cultures.  Acute hypoxic respiratory failure: Saturation dropped to 81% on room air.  Continue 2 L/min oxygen via nasal cannula.  Taper off oxygen as able.  Hypokalemia: Improved  Mildly elevated troponins: This is likely from demand ischemia.  EKG showed normal sinus rhythm, IVCD.  Other comorbidities include hypertension, GERD, hyperlipidemia, tobacco use disorder   Diet Order             Diet regular Room service appropriate? Yes; Fluid consistency: Thin  Diet effective now                            Consultants: None  Procedures: None    Medications:    aspirin EC  81 mg Oral Daily   enoxaparin (LOVENOX) injection  40 mg Subcutaneous Q24H    losartan  50 mg Oral Daily   pantoprazole  40 mg Oral Daily   PARoxetine  40 mg Oral Daily   Continuous Infusions:  cefTRIAXone (ROCEPHIN)  IV 2 g (05/17/22 0048)   doxycycline (VIBRAMYCIN) IV 100 mg (05/17/22 0547)     Anti-infectives (From admission, onward)    Start     Dose/Rate Route Frequency Ordered Stop   05/17/22 0000  cefTRIAXone (ROCEPHIN) 2 g in sodium chloride 0.9 % 100 mL IVPB        2 g 200 mL/hr over 30 Minutes Intravenous Every 24 hours 05/16/22 2111 05/21/22 2359   05/16/22 1800  doxycycline (VIBRAMYCIN) 100 mg in sodium chloride 0.9 % 250 mL IVPB        100 mg 125 mL/hr over 120 Minutes Intravenous Every 12 hours 05/16/22 1721     05/16/22 1730  cefTRIAXone (ROCEPHIN) 1 g in sodium chloride 0.9 % 100 mL IVPB        1 g 200 mL/hr over 30 Minutes Intravenous  Once 05/16/22 1721 05/16/22 1847              Family Communication/Anticipated D/C date and plan/Code Status   DVT prophylaxis: enoxaparin (LOVENOX) injection 40 mg Start: 05/16/22 2200     Code Status: DNR  Family Communication: None Disposition Plan: Plan to discharge home tomorrow   Status is: Observation The patient will  require care spanning > 2 midnights and should be moved to inpatient because: IV antibiotics for pneumonia       Subjective:   Interval events noted.  She complains of cough.  Chest pain has improved.  Her breathing is also better.  Objective:    Vitals:   05/16/22 2004 05/17/22 0056 05/17/22 0519 05/17/22 0742  BP: (!) 143/90 138/74 (!) 141/76 113/76  Pulse: 75 71 72 83  Resp: 18 18 18 16   Temp: 98.1 F (36.7 C) 98.4 F (36.9 C) 98.5 F (36.9 C) 98.4 F (36.9 C)  TempSrc:  Oral Oral Oral  SpO2: 96% 98% 97% 94%  Weight:      Height:       No data found.   Intake/Output Summary (Last 24 hours) at 05/17/2022 1131 Last data filed at 05/17/2022 1000 Gross per 24 hour  Intake 240 ml  Output --  Net 240 ml   Filed Weights   05/16/22 1334  Weight: 59  kg    Exam:  GEN: NAD SKIN: No rash EYES: EOMI ENT: MMM CV: RRR PULM: Bilateral rhonchi, bibasilar rales ABD: soft, ND, NT, +BS CNS: AAO x 3, non focal EXT: No edema or tenderness        Data Reviewed:   I have personally reviewed following labs and imaging studies:  Labs: Labs show the following:   Basic Metabolic Panel: Recent Labs  Lab 05/16/22 1351 05/17/22 0519  NA 137 139  K 3.4* 3.7  CL 102 110  CO2 22 24  GLUCOSE 121* 108*  BUN 21 15  CREATININE 0.87 0.78  CALCIUM 8.9 8.0*   GFR Estimated Creatinine Clearance: 48.8 mL/min (by C-G formula based on SCr of 0.78 mg/dL). Liver Function Tests: No results for input(s): "AST", "ALT", "ALKPHOS", "BILITOT", "PROT", "ALBUMIN" in the last 168 hours. No results for input(s): "LIPASE", "AMYLASE" in the last 168 hours. No results for input(s): "AMMONIA" in the last 168 hours. Coagulation profile No results for input(s): "INR", "PROTIME" in the last 168 hours.  CBC: Recent Labs  Lab 05/16/22 1351 05/17/22 0519  WBC 10.3 8.6  HGB 13.3 11.3*  HCT 39.9 33.6*  MCV 92.8 94.1  PLT 238 215   Cardiac Enzymes: No results for input(s): "CKTOTAL", "CKMB", "CKMBINDEX", "TROPONINI" in the last 168 hours. BNP (last 3 results) No results for input(s): "PROBNP" in the last 8760 hours. CBG: No results for input(s): "GLUCAP" in the last 168 hours. D-Dimer: No results for input(s): "DDIMER" in the last 72 hours. Hgb A1c: No results for input(s): "HGBA1C" in the last 72 hours. Lipid Profile: No results for input(s): "CHOL", "HDL", "LDLCALC", "TRIG", "CHOLHDL", "LDLDIRECT" in the last 72 hours. Thyroid function studies: No results for input(s): "TSH", "T4TOTAL", "T3FREE", "THYROIDAB" in the last 72 hours.  Invalid input(s): "FREET3" Anemia work up: No results for input(s): "VITAMINB12", "FOLATE", "FERRITIN", "TIBC", "IRON", "RETICCTPCT" in the last 72 hours. Sepsis Labs: Recent Labs  Lab 05/16/22 1351  05/16/22 1543 05/17/22 0519  PROCALCITON  --  0.18  --   WBC 10.3  --  8.6    Microbiology Recent Results (from the past 240 hour(s))  Resp Panel by RT-PCR (Flu A&B, Covid) Anterior Nasal Swab     Status: None   Collection Time: 05/16/22  2:34 PM   Specimen: Anterior Nasal Swab  Result Value Ref Range Status   SARS Coronavirus 2 by RT PCR NEGATIVE NEGATIVE Final    Comment: (NOTE) SARS-CoV-2 target nucleic acids are NOT DETECTED.  The SARS-CoV-2 RNA is generally detectable in upper respiratory specimens during the acute phase of infection. The lowest concentration of SARS-CoV-2 viral copies this assay can detect is 138 copies/mL. A negative result does not preclude SARS-Cov-2 infection and should not be used as the sole basis for treatment or other patient management decisions. A negative result may occur with  improper specimen collection/handling, submission of specimen other than nasopharyngeal swab, presence of viral mutation(s) within the areas targeted by this assay, and inadequate number of viral copies(<138 copies/mL). A negative result must be combined with clinical observations, patient history, and epidemiological information. The expected result is Negative.  Fact Sheet for Patients:  BloggerCourse.comhttps://www.fda.gov/media/152166/download  Fact Sheet for Healthcare Providers:  SeriousBroker.ithttps://www.fda.gov/media/152162/download  This test is no t yet approved or cleared by the [place] FDA and  has been authorized for detection and/or diagnosis of SARS-CoV-2 by FDA under an Emergency Use Authorization (EUA). This EUA will remain  in effect (meaning this test can be used) for the duration of the COVID-19 declaration under Section 564(b)(1) of the Act, 21 U.S.C.section 360bbb-3(b)(1), unless the authorization is terminated  or revoked sooner.       Influenza A by PCR NEGATIVE NEGATIVE Final   Influenza B by PCR NEGATIVE NEGATIVE Final    Comment: (NOTE) The Xpert Xpress  SARS-CoV-2/FLU/RSV plus assay is intended as an aid in the diagnosis of influenza from Nasopharyngeal swab specimens and should not be used as a sole basis for treatment. Nasal washings and aspirates are unacceptable for Xpert Xpress SARS-CoV-2/FLU/RSV testing.  Fact Sheet for Patients: BloggerCourse.comhttps://www.fda.gov/media/152166/download  Fact Sheet for Healthcare Providers: SeriousBroker.ithttps://www.fda.gov/media/152162/download  This test is not yet approved or cleared by the [place] FDA and has been authorized for detection and/or diagnosis of SARS-CoV-2 by FDA under an Emergency Use Authorization (EUA). This EUA will remain in effect (meaning this test can be used) for the duration of the COVID-19 declaration under Section 564(b)(1) of the Act, 21 U.S.C. section 360bbb-3(b)(1), unless the authorization is terminated or revoked.  Performed at [facility], [address] [place], [place] [address]   Respiratory (~20 pathogens) panel by PCR     Status: None   Collection Time: 05/16/22  3:42 PM   Specimen: Nasopharyngeal Swab; Respiratory  Result Value Ref Range Status   Adenovirus NOT DETECTED NOT DETECTED Final   Coronavirus 229E NOT DETECTED NOT DETECTED Final    Comment: (NOTE) The Coronavirus on the Respiratory Panel, DOES NOT test for the novel  Coronavirus (2019 nCoV)    Coronavirus HKU1 NOT DETECTED NOT DETECTED Final   Coronavirus NL63 NOT DETECTED NOT DETECTED Final   Coronavirus OC43 NOT DETECTED NOT DETECTED Final   Metapneumovirus NOT DETECTED NOT DETECTED Final   Rhinovirus / Enterovirus NOT DETECTED NOT DETECTED Final   Influenza A NOT DETECTED NOT DETECTED Final   Influenza B NOT DETECTED NOT DETECTED Final   Parainfluenza Virus 1 NOT DETECTED NOT DETECTED Final   Parainfluenza Virus 2 NOT DETECTED NOT DETECTED Final   Parainfluenza Virus 3 NOT DETECTED NOT DETECTED Final   Parainfluenza Virus 4 NOT DETECTED NOT DETECTED Final   Respiratory Syncytial Virus NOT  DETECTED NOT DETECTED Final   Bordetella pertussis NOT DETECTED NOT DETECTED Final   Bordetella Parapertussis NOT DETECTED NOT DETECTED Final   Chlamydophila pneumoniae NOT DETECTED NOT DETECTED Final   Mycoplasma pneumoniae NOT DETECTED NOT DETECTED Final    Comment: Performed at [facility], [address] [place], [place] [address]  Blood culture (routine x 2)     Status: None (Preliminary result)   Collection Time: 05/16/22  5:21 PM   Specimen: BLOOD  Result Value Ref Range Status   Specimen Description BLOOD BLOOD LEFT HAND  Final   Special Requests   Final    BOTTLES DRAWN AEROBIC AND ANAEROBIC Blood Culture adequate volume   Culture   Final    NO GROWTH < 12 HOURS Performed at [facility], [address], [place], [place] [address]    Report Status PENDING  Incomplete  Blood culture (routine x 2)     Status: None (Preliminary result)   Collection Time: 05/16/22  5:26 PM   Specimen: BLOOD  Result Value Ref Range Status   Specimen Description BLOOD LEFT ANTECUBITAL  Final   Special Requests   Final    BOTTLES DRAWN AEROBIC AND ANAEROBIC Blood Culture adequate volume   Culture   Final    NO GROWTH < 12 HOURS Performed at [facility], [address], [place], [place] [address]    Report Status PENDING  Incomplete    Procedures and diagnostic studies:  CT Angio Chest PE W and/or Wo Contrast  Result Date: 05/16/2022 CLINICAL DATA:  Pulmonary embolism (PE) suspected, high prob EXAM: CT ANGIOGRAPHY CHEST WITH CONTRAST TECHNIQUE: Multidetector CT imaging of the chest was performed using the standard protocol during bolus administration of intravenous contrast. Multiplanar CT image reconstructions and MIPs were obtained to evaluate the vascular anatomy. RADIATION DOSE REDUCTION: This exam was performed according to the departmental dose-optimization program which includes automated exposure control, adjustment of the mA and/or kV according to patient size  and/or use of iterative reconstruction technique. CONTRAST:  1mL OMNIPAQUE IOHEXOL 350 MG/ML SOLN COMPARISON:  CT dated August 14, 2020. FINDINGS: Cardiovascular: Satisfactory opacification of the pulmonary arteries to the segmental level. Limited evaluation of the bases secondary to respiratory motion. No evidence of pulmonary embolism. Heart is the upper limits of normal. No pericardial effusion. Three-vessel coronary artery atherosclerotic calcifications. Atherosclerotic calcifications throughout the nonaneurysmal thoracic aorta. Mediastinum/Nodes: Visualized thyroid is unremarkable. No axillary adenopathy. Mildly increased size of mediastinal lymph nodes with representative pretracheal lymph node measuring 12 mm in the short axis, previously 8 mm (series 4, image 53). Lungs/Pleura: No pleural effusion or pneumothorax. New peripheral centrilobular ground-glass opacities most predominantly at the RIGHT lung base and bilateral apices. New small hypoenhancing consolidative opacities of the RIGHT lung base, peripheral RIGHT middle lobe, posterior LEFT lower lobe, and lingula. Mild centrilobular emphysema. Mild bronchial wall thickening of the RIGHT lower and upper lobe. Upper Abdomen: No acute abnormality. Musculoskeletal: Degenerative changes of the thoracolumbar spine. Review of the MIP images confirms the above findings. IMPRESSION: 1. No acute pulmonary embolism. 2. New scattered bilateral ground-glass and consolidative opacities, most consistent with multifocal infection. 3. Increase in size of multiple mediastinal lymph nodes, likely reactive. Aortic Atherosclerosis (ICD10-I70.0) and Emphysema (ICD10-J43.9). Electronically Signed   By: Meda Klinefelter M.D.   On: 05/16/2022 17:13   DG Chest 2 View  Result Date: 05/16/2022 CLINICAL DATA:  Productive cough. EXAM: CHEST - 2 VIEW COMPARISON:  Chest CT August 14, 2020 FINDINGS: Tortuosity and calcific atherosclerotic disease of the aorta. Cardiomediastinal  silhouette is normal. Mediastinal contours appear intact. There is no evidence of focal airspace consolidation, pleural effusion or pneumothorax. Diffuse coarsening of the interstitial markings. Osseous structures are without acute abnormality. Soft tissues are grossly normal. IMPRESSION: 1. Diffuse coarsening of the interstitial markings. This may be due to  peribronchial consolidation, bronchitis or atypical pneumonia. 2. No evidence of lobar consolidation. Electronically Signed   By: Ted Mcalpine M.D.   On: 05/16/2022 14:15               LOS: 0 days   Senora Lacson  [facility]   Pager on [email or web address]. If 7PM-7AM, please contact night-coverage at [email or web address]     05/17/2022, 11:31 AM

## 2022-05-18 DIAGNOSIS — J9601 Acute respiratory failure with hypoxia: Secondary | ICD-10-CM | POA: Diagnosis not present

## 2022-05-18 DIAGNOSIS — J189 Pneumonia, unspecified organism: Secondary | ICD-10-CM

## 2022-05-18 DIAGNOSIS — I10 Essential (primary) hypertension: Secondary | ICD-10-CM | POA: Diagnosis not present

## 2022-05-18 MED ORDER — DOXYCYCLINE HYCLATE 100 MG PO CAPS: 100.0000 mg | ORAL_CAPSULE | Freq: Two times a day (BID) | ORAL | 0 refills | Status: AC

## 2022-05-18 MED ORDER — AMOXICILLIN-POT CLAVULANATE 875-125 MG PO TABS: 1.0000 | ORAL_TABLET | Freq: Two times a day (BID) | ORAL | 0 refills | Status: AC

## 2022-05-18 MED ORDER — DOXYCYCLINE HYCLATE 100 MG PO CAPS: 100.0000 mg | ORAL_CAPSULE | Freq: Two times a day (BID) | ORAL | 0 refills | Status: DC

## 2022-05-18 MED ORDER — AMOXICILLIN-POT CLAVULANATE 875-125 MG PO TABS: 1.0000 | ORAL_TABLET | Freq: Two times a day (BID) | ORAL | 0 refills | Status: DC

## 2022-05-18 MED ORDER — GUAIFENESIN-DM 100-10 MG/5ML PO SYRP: 5.0000 mL | ORAL_SOLUTION | Freq: Three times a day (TID) | ORAL | 0 refills | Status: AC | PRN

## 2022-05-18 NOTE — Discharge Summary (Signed)
Physician Discharge Summary   Patient: Joann Thomas MRN: [id number] DOB: November 09, 1940  Admit date:     05/16/2022  Discharge date: {dischdate:26783}  Discharge Physician: Jennye Boroughs   PCP: Juluis Pitch, MD   Recommendations at discharge:  {Tip this will not be part of the note when signed- Example include specific recommendations for outpatient follow-up, pending tests to follow-up on. (Optional):26781}  ***  Discharge Diagnoses: Principal Problem:   Community acquired bacterial pneumonia Active Problems:   Hyperlipidemia   Essential hypertension   Acute respiratory failure with hypoxia ([place])   Multifocal pneumonia  Resolved Problems:   * No resolved hospital problems. [facility] Course: No notes on file  Assessment and Plan: No notes have been filed under this hospital service. Service: Hospitalist     {Tip this will not be part of the note when signed Body mass index is 21.63 kg/m. , ,  (Optional):26781}  {(NOTE) Pain control PDMP Statment (Optional):26782} Consultants: *** Procedures performed: ***  Disposition: {Plan; Disposition:26390} Diet recommendation:  Discharge Diet Orders (From admission, onward)     Start     Ordered   05/18/22 0000  Diet - low sodium heart healthy        05/18/22 1213           {Diet_Plan:26776} DISCHARGE MEDICATION: Allergies as of 05/18/2022       Reactions   Green Dye Other (See Comments)   Patient has allergy to hair dye and develops whelps when in contact.  Patient has allergy to hair dye and develops whelps when in contact.  Patient has allergy to hair dye and develops whelps when in contact.    Other Other (See Comments)   Hair dye- causes whelps on scalp   Zithromax [azithromycin] Rash   Rash on torso that looks like a burn        Medication List     STOP taking these medications    Prednisolon-Gatiflox-Bromfenac 1-0.5-0.075 % Susp   ranitidine 150 MG capsule Commonly known as: ZANTAC        TAKE these medications    albuterol 108 (90 Base) MCG/ACT inhaler Commonly known as: VENTOLIN HFA Inhale 2 puffs into the lungs every 6 (six) hours as needed.   amLODipine 10 MG tablet Commonly known as: NORVASC Take 10 mg by mouth daily.   amoxicillin 500 MG capsule Commonly known as: AMOXIL Take 2,000 mg by mouth See admin instructions. Take 4 capsules one hour before dental procedure   amoxicillin-clavulanate 875-125 MG tablet Commonly known as: AUGMENTIN Take 1 tablet by mouth 2 (two) times daily for 5 days.   aspirin EC 81 MG tablet Take 1 tablet (81 mg total) by mouth daily.   atorvastatin 40 MG tablet Commonly known as: LIPITOR Take 1 tablet (40 mg total) by mouth daily at 6 PM.   Calcium 600 600 MG Tabs tablet Generic drug: calcium carbonate Take 600 mg by mouth 2 (two) times daily with a meal.   doxycycline 100 MG capsule Commonly known as: VIBRAMYCIN Take 1 capsule (100 mg total) by mouth 2 (two) times daily for 5 days.   fluticasone 50 MCG/ACT nasal spray Commonly known as: FLONASE Place 2 sprays into both nostrils daily.   guaiFENesin-dextromethorphan 100-10 MG/5ML syrup Commonly known as: ROBITUSSIN DM Take 5 mLs by mouth every 8 (eight) hours as needed for cough.   losartan 50 MG tablet Commonly known as: COZAAR Take 50 mg by mouth daily.   omeprazole 20 MG capsule Commonly  known as: PRILOSEC Take 20 mg by mouth daily.   PARoxetine 40 MG tablet Commonly known as: PAXIL Take 40 mg by mouth daily. What changed: Another medication with the same name was removed. Continue taking this medication, and follow the directions you see here.   Vitamin D3 50 MCG (2000 UT) Tabs Take 2,000 Units by mouth daily.        Discharge Exam: Filed Weights   05/16/22 1334  Weight: 59 kg   GEN: NAD SKIN: No rash*** EYES: EOMI*** ENT: MMM CV: RRR PULM: Bibasilar rales, no wheezing ABD: soft, ND, NT, +BS CNS: AAO x 3, non focal EXT: No edema or  tenderness***   Condition at discharge: {DC Condition:26389}  The results of significant diagnostics from this hospitalization (including imaging, microbiology, ancillary and laboratory) are listed below for reference.   Imaging Studies: CT Angio Chest PE W and/or Wo Contrast  Result Date: 05/16/2022 CLINICAL DATA:  Pulmonary embolism (PE) suspected, high prob EXAM: CT ANGIOGRAPHY CHEST WITH CONTRAST TECHNIQUE: Multidetector CT imaging of the chest was performed using the standard protocol during bolus administration of intravenous contrast. Multiplanar CT image reconstructions and MIPs were obtained to evaluate the vascular anatomy. RADIATION DOSE REDUCTION: This exam was performed according to the departmental dose-optimization program which includes automated exposure control, adjustment of the mA and/or kV according to patient size and/or use of iterative reconstruction technique. CONTRAST:  107mL OMNIPAQUE IOHEXOL 350 MG/ML SOLN COMPARISON:  CT dated August 14, 2020. FINDINGS: Cardiovascular: Satisfactory opacification of the pulmonary arteries to the segmental level. Limited evaluation of the bases secondary to respiratory motion. No evidence of pulmonary embolism. Heart is the upper limits of normal. No pericardial effusion. Three-vessel coronary artery atherosclerotic calcifications. Atherosclerotic calcifications throughout the nonaneurysmal thoracic aorta. Mediastinum/Nodes: Visualized thyroid is unremarkable. No axillary adenopathy. Mildly increased size of mediastinal lymph nodes with representative pretracheal lymph node measuring 12 mm in the short axis, previously 8 mm (series 4, image 53). Lungs/Pleura: No pleural effusion or pneumothorax. New peripheral centrilobular ground-glass opacities most predominantly at the RIGHT lung base and bilateral apices. New small hypoenhancing consolidative opacities of the RIGHT lung base, peripheral RIGHT middle lobe, posterior LEFT lower lobe, and  lingula. Mild centrilobular emphysema. Mild bronchial wall thickening of the RIGHT lower and upper lobe. Upper Abdomen: No acute abnormality. Musculoskeletal: Degenerative changes of the thoracolumbar spine. Review of the MIP images confirms the above findings. IMPRESSION: 1. No acute pulmonary embolism. 2. New scattered bilateral ground-glass and consolidative opacities, most consistent with multifocal infection. 3. Increase in size of multiple mediastinal lymph nodes, likely reactive. Aortic Atherosclerosis (ICD10-I70.0) and Emphysema (ICD10-J43.9). Electronically Signed   By: Valentino Saxon M.D.   On: 05/16/2022 17:13   DG Chest 2 View  Result Date: 05/16/2022 CLINICAL DATA:  Productive cough. EXAM: CHEST - 2 VIEW COMPARISON:  Chest CT August 14, 2020 FINDINGS: Tortuosity and calcific atherosclerotic disease of the aorta. Cardiomediastinal silhouette is normal. Mediastinal contours appear intact. There is no evidence of focal airspace consolidation, pleural effusion or pneumothorax. Diffuse coarsening of the interstitial markings. Osseous structures are without acute abnormality. Soft tissues are grossly normal. IMPRESSION: 1. Diffuse coarsening of the interstitial markings. This may be due to peribronchial consolidation, bronchitis or atypical pneumonia. 2. No evidence of lobar consolidation. Electronically Signed   By: Fidela Salisbury M.D.   On: 05/16/2022 14:15    Microbiology: Results for orders placed or performed during the hospital encounter of 05/16/22  Resp Panel by RT-PCR (Flu A&B, Covid) Anterior  Nasal Swab     Status: None   Collection Time: 05/16/22  2:34 PM   Specimen: Anterior Nasal Swab  Result Value Ref Range Status   SARS Coronavirus 2 by RT PCR NEGATIVE NEGATIVE Final    Comment: (NOTE) SARS-CoV-2 target nucleic acids are NOT DETECTED.  The SARS-CoV-2 RNA is generally detectable in upper respiratory specimens during the acute phase of infection. The  lowest concentration of SARS-CoV-2 viral copies this assay can detect is 138 copies/mL. A negative result does not preclude SARS-Cov-2 infection and should not be used as the sole basis for treatment or other patient management decisions. A negative result may occur with  improper specimen collection/handling, submission of specimen other than nasopharyngeal swab, presence of viral mutation(s) within the areas targeted by this assay, and inadequate number of viral copies(<138 copies/mL). A negative result must be combined with clinical observations, patient history, and epidemiological information. The expected result is Negative.  Fact Sheet for Patients:  BloggerCourse.com  Fact Sheet for Healthcare Providers:  SeriousBroker.it  This test is no t yet approved or cleared by the [place] FDA and  has been authorized for detection and/or diagnosis of SARS-CoV-2 by FDA under an Emergency Use Authorization (EUA). This EUA will remain  in effect (meaning this test can be used) for the duration of the COVID-19 declaration under Section 564(b)(1) of the Act, 21 U.S.C.section 360bbb-3(b)(1), unless the authorization is terminated  or revoked sooner.       Influenza A by PCR NEGATIVE NEGATIVE Final   Influenza B by PCR NEGATIVE NEGATIVE Final    Comment: (NOTE) The Xpert Xpress SARS-CoV-2/FLU/RSV plus assay is intended as an aid in the diagnosis of influenza from Nasopharyngeal swab specimens and should not be used as a sole basis for treatment. Nasal washings and aspirates are unacceptable for Xpert Xpress SARS-CoV-2/FLU/RSV testing.  Fact Sheet for Patients: BloggerCourse.com  Fact Sheet for Healthcare Providers: SeriousBroker.it  This test is not yet approved or cleared by the [place] FDA and has been authorized for detection and/or diagnosis of SARS-CoV-2 by FDA under  an Emergency Use Authorization (EUA). This EUA will remain in effect (meaning this test can be used) for the duration of the COVID-19 declaration under Section 564(b)(1) of the Act, 21 U.S.C. section 360bbb-3(b)(1), unless the authorization is terminated or revoked.  Performed at [facility], [address] [place], [place] [address]   Respiratory (~20 pathogens) panel by PCR     Status: None   Collection Time: 05/16/22  3:42 PM   Specimen: Nasopharyngeal Swab; Respiratory  Result Value Ref Range Status   Adenovirus NOT DETECTED NOT DETECTED Final   Coronavirus 229E NOT DETECTED NOT DETECTED Final    Comment: (NOTE) The Coronavirus on the Respiratory Panel, DOES NOT test for the novel  Coronavirus (2019 nCoV)    Coronavirus HKU1 NOT DETECTED NOT DETECTED Final   Coronavirus NL63 NOT DETECTED NOT DETECTED Final   Coronavirus OC43 NOT DETECTED NOT DETECTED Final   Metapneumovirus NOT DETECTED NOT DETECTED Final   Rhinovirus / Enterovirus NOT DETECTED NOT DETECTED Final   Influenza A NOT DETECTED NOT DETECTED Final   Influenza B NOT DETECTED NOT DETECTED Final   Parainfluenza Virus 1 NOT DETECTED NOT DETECTED Final   Parainfluenza Virus 2 NOT DETECTED NOT DETECTED Final   Parainfluenza Virus 3 NOT DETECTED NOT DETECTED Final   Parainfluenza Virus 4 NOT DETECTED NOT DETECTED Final   Respiratory Syncytial Virus NOT DETECTED NOT DETECTED Final  Bordetella pertussis NOT DETECTED NOT DETECTED Final   Bordetella Parapertussis NOT DETECTED NOT DETECTED Final   Chlamydophila pneumoniae NOT DETECTED NOT DETECTED Final   Mycoplasma pneumoniae NOT DETECTED NOT DETECTED Final    Comment: Performed at [facility], [address] [place], [place] [address]  Blood culture (routine x 2)     Status: None (Preliminary result)   Collection Time: 05/16/22  5:21 PM   Specimen: BLOOD  Result Value Ref Range Status   Specimen Description BLOOD BLOOD LEFT HAND  Final   Special  Requests   Final    BOTTLES DRAWN AEROBIC AND ANAEROBIC Blood Culture adequate volume   Culture   Final    NO GROWTH 2 DAYS Performed at [facility], [address] [place], [place] [address]    Report Status PENDING  Incomplete  Blood culture (routine x 2)     Status: None (Preliminary result)   Collection Time: 05/16/22  5:26 PM   Specimen: BLOOD  Result Value Ref Range Status   Specimen Description BLOOD LEFT ANTECUBITAL  Final   Special Requests   Final    BOTTLES DRAWN AEROBIC AND ANAEROBIC Blood Culture adequate volume   Culture   Final    NO GROWTH 2 DAYS Performed at [facility], [address] [place], [place] [address]    Report Status PENDING  Incomplete    Labs: CBC: Recent Labs  Lab 05/16/22 1351 05/17/22 0519  WBC 10.3 8.6  HGB 13.3 11.3*  HCT 39.9 33.6*  MCV 92.8 94.1  PLT 238 215   Basic Metabolic Panel: Recent Labs  Lab 05/16/22 1351 05/17/22 0519  NA 137 139  K 3.4* 3.7  CL 102 110  CO2 22 24  GLUCOSE 121* 108*  BUN 21 15  CREATININE 0.87 0.78  CALCIUM 8.9 8.0*   Liver Function Tests: No results for input(s): "AST", "ALT", "ALKPHOS", "BILITOT", "PROT", "ALBUMIN" in the last 168 hours. CBG: No results for input(s): "GLUCAP" in the last 168 hours.  Discharge time spent: {LESS THAN/GREATER DSKA:76811} 30 minutes.  Signed: Lurene Shadow, MD [facility] 05/18/2022

## 2022-05-18 NOTE — TOC Transition Note (Signed)
Transition of Care [facility]) - CM/SW Discharge Note   Patient Details  Name: Joann Thomas MRN: [id number] Date of Birth: March 24, 1941  [facility] CM/SW Contact:  Truddie Hidden, RN Phone Number: 05/18/2022, 1:54 PM   Clinical Narrative:    Spoke with patient regarding discharge home. Patient husband will transport her home. Patient request home health via [facility]. Referral sent and accepted by Feliberto Gottron. TOC signing off          Patient Goals and CMS Choice        Discharge Placement                       Discharge Plan and Services                                     Social Determinants of Health (SDOH) Interventions     Readmission Risk Interventions     No data to display

## 2022-05-21 LAB — CULTURE, BLOOD (ROUTINE X 2)
Culture: NO GROWTH
Culture: NO GROWTH
Special Requests: ADEQUATE
Special Requests: ADEQUATE

## 2022-08-04 ENCOUNTER — Other Ambulatory Visit: Payer: Self-pay | Admitting: Family Medicine

## 2022-08-04 DIAGNOSIS — Z1231 Encounter for screening mammogram for malignant neoplasm of breast: Secondary | ICD-10-CM

## 2022-10-26 ENCOUNTER — Ambulatory Visit
Admission: RE | Admit: 2022-10-26 | Discharge: 2022-10-26 | Disposition: A | Discharge status: Home / Self Care | Payer: Medicare HMO | Source: Ambulatory Visit | Attending: Family Medicine | Admitting: Family Medicine

## 2022-10-26 DIAGNOSIS — Z1231 Encounter for screening mammogram for malignant neoplasm of breast: Secondary | ICD-10-CM | POA: Diagnosis not present

## 2022-11-04 DIAGNOSIS — F413 Other mixed anxiety disorders: Secondary | ICD-10-CM | POA: Diagnosis not present

## 2022-11-05 DIAGNOSIS — E785 Hyperlipidemia, unspecified: Secondary | ICD-10-CM | POA: Diagnosis not present

## 2022-11-05 DIAGNOSIS — R0789 Other chest pain: Secondary | ICD-10-CM | POA: Diagnosis not present

## 2022-11-05 DIAGNOSIS — I2089 Other forms of angina pectoris: Secondary | ICD-10-CM | POA: Diagnosis not present

## 2022-11-05 DIAGNOSIS — R0602 Shortness of breath: Secondary | ICD-10-CM | POA: Diagnosis not present

## 2022-11-05 DIAGNOSIS — I361 Nonrheumatic tricuspid (valve) insufficiency: Secondary | ICD-10-CM | POA: Diagnosis not present

## 2022-11-05 DIAGNOSIS — I10 Essential (primary) hypertension: Secondary | ICD-10-CM | POA: Diagnosis not present

## 2022-11-05 DIAGNOSIS — K219 Gastro-esophageal reflux disease without esophagitis: Secondary | ICD-10-CM | POA: Diagnosis not present

## 2022-11-05 DIAGNOSIS — Z87891 Personal history of nicotine dependence: Secondary | ICD-10-CM | POA: Diagnosis not present

## 2022-11-05 DIAGNOSIS — Z8673 Personal history of transient ischemic attack (TIA), and cerebral infarction without residual deficits: Secondary | ICD-10-CM | POA: Diagnosis not present

## 2022-11-16 DIAGNOSIS — K219 Gastro-esophageal reflux disease without esophagitis: Secondary | ICD-10-CM | POA: Diagnosis not present

## 2022-11-16 DIAGNOSIS — R058 Other specified cough: Secondary | ICD-10-CM | POA: Diagnosis not present

## 2022-11-16 DIAGNOSIS — R0982 Postnasal drip: Secondary | ICD-10-CM | POA: Diagnosis not present

## 2022-12-02 DIAGNOSIS — F413 Other mixed anxiety disorders: Secondary | ICD-10-CM | POA: Diagnosis not present

## 2022-12-29 DIAGNOSIS — D2261 Melanocytic nevi of right upper limb, including shoulder: Secondary | ICD-10-CM | POA: Diagnosis not present

## 2022-12-29 DIAGNOSIS — D485 Neoplasm of uncertain behavior of skin: Secondary | ICD-10-CM | POA: Diagnosis not present

## 2022-12-29 DIAGNOSIS — C44722 Squamous cell carcinoma of skin of right lower limb, including hip: Secondary | ICD-10-CM | POA: Diagnosis not present

## 2022-12-29 DIAGNOSIS — D2271 Melanocytic nevi of right lower limb, including hip: Secondary | ICD-10-CM | POA: Diagnosis not present

## 2022-12-29 DIAGNOSIS — D225 Melanocytic nevi of trunk: Secondary | ICD-10-CM | POA: Diagnosis not present

## 2022-12-29 DIAGNOSIS — D2262 Melanocytic nevi of left upper limb, including shoulder: Secondary | ICD-10-CM | POA: Diagnosis not present

## 2022-12-29 DIAGNOSIS — D2272 Melanocytic nevi of left lower limb, including hip: Secondary | ICD-10-CM | POA: Diagnosis not present

## 2022-12-29 DIAGNOSIS — L821 Other seborrheic keratosis: Secondary | ICD-10-CM | POA: Diagnosis not present

## 2022-12-29 DIAGNOSIS — L814 Other melanin hyperpigmentation: Secondary | ICD-10-CM | POA: Diagnosis not present

## 2023-01-13 DIAGNOSIS — F413 Other mixed anxiety disorders: Secondary | ICD-10-CM | POA: Diagnosis not present

## 2023-01-17 DIAGNOSIS — H903 Sensorineural hearing loss, bilateral: Secondary | ICD-10-CM | POA: Diagnosis not present

## 2023-01-25 DIAGNOSIS — H903 Sensorineural hearing loss, bilateral: Secondary | ICD-10-CM | POA: Diagnosis not present

## 2023-02-02 DIAGNOSIS — L578 Other skin changes due to chronic exposure to nonionizing radiation: Secondary | ICD-10-CM | POA: Diagnosis not present

## 2023-02-02 DIAGNOSIS — L814 Other melanin hyperpigmentation: Secondary | ICD-10-CM | POA: Diagnosis not present

## 2023-02-02 DIAGNOSIS — L988 Other specified disorders of the skin and subcutaneous tissue: Secondary | ICD-10-CM | POA: Diagnosis not present

## 2023-02-02 DIAGNOSIS — C44722 Squamous cell carcinoma of skin of right lower limb, including hip: Secondary | ICD-10-CM | POA: Diagnosis not present

## 2023-02-07 DIAGNOSIS — F413 Other mixed anxiety disorders: Secondary | ICD-10-CM | POA: Diagnosis not present

## 2023-02-08 DIAGNOSIS — R053 Chronic cough: Secondary | ICD-10-CM | POA: Diagnosis not present

## 2023-02-08 DIAGNOSIS — K219 Gastro-esophageal reflux disease without esophagitis: Secondary | ICD-10-CM | POA: Diagnosis not present

## 2023-02-24 DIAGNOSIS — F413 Other mixed anxiety disorders: Secondary | ICD-10-CM | POA: Diagnosis not present

## 2023-03-03 DIAGNOSIS — H6123 Impacted cerumen, bilateral: Secondary | ICD-10-CM | POA: Diagnosis not present

## 2023-03-03 DIAGNOSIS — H903 Sensorineural hearing loss, bilateral: Secondary | ICD-10-CM | POA: Diagnosis not present

## 2023-03-11 DIAGNOSIS — F413 Other mixed anxiety disorders: Secondary | ICD-10-CM | POA: Diagnosis not present

## 2023-03-21 DIAGNOSIS — K219 Gastro-esophageal reflux disease without esophagitis: Secondary | ICD-10-CM | POA: Diagnosis not present

## 2023-03-21 DIAGNOSIS — R053 Chronic cough: Secondary | ICD-10-CM | POA: Diagnosis not present

## 2023-03-31 DIAGNOSIS — F413 Other mixed anxiety disorders: Secondary | ICD-10-CM | POA: Diagnosis not present

## 2023-04-06 DIAGNOSIS — Z08 Encounter for follow-up examination after completed treatment for malignant neoplasm: Secondary | ICD-10-CM | POA: Diagnosis not present

## 2023-04-06 DIAGNOSIS — Z85828 Personal history of other malignant neoplasm of skin: Secondary | ICD-10-CM | POA: Diagnosis not present

## 2023-04-28 DIAGNOSIS — F413 Other mixed anxiety disorders: Secondary | ICD-10-CM | POA: Diagnosis not present

## 2023-05-10 DIAGNOSIS — R053 Chronic cough: Secondary | ICD-10-CM | POA: Diagnosis not present

## 2023-05-10 DIAGNOSIS — R0989 Other specified symptoms and signs involving the circulatory and respiratory systems: Secondary | ICD-10-CM | POA: Diagnosis not present

## 2023-05-10 DIAGNOSIS — Z87891 Personal history of nicotine dependence: Secondary | ICD-10-CM | POA: Diagnosis not present

## 2023-05-30 DIAGNOSIS — E785 Hyperlipidemia, unspecified: Secondary | ICD-10-CM | POA: Diagnosis not present

## 2023-05-30 DIAGNOSIS — R0602 Shortness of breath: Secondary | ICD-10-CM | POA: Diagnosis not present

## 2023-05-30 DIAGNOSIS — I361 Nonrheumatic tricuspid (valve) insufficiency: Secondary | ICD-10-CM | POA: Diagnosis not present

## 2023-05-30 DIAGNOSIS — F17200 Nicotine dependence, unspecified, uncomplicated: Secondary | ICD-10-CM | POA: Diagnosis not present

## 2023-05-30 DIAGNOSIS — Z8673 Personal history of transient ischemic attack (TIA), and cerebral infarction without residual deficits: Secondary | ICD-10-CM | POA: Diagnosis not present

## 2023-05-30 DIAGNOSIS — I10 Essential (primary) hypertension: Secondary | ICD-10-CM | POA: Diagnosis not present

## 2023-05-30 DIAGNOSIS — I2089 Other forms of angina pectoris: Secondary | ICD-10-CM | POA: Diagnosis not present

## 2023-05-30 DIAGNOSIS — K219 Gastro-esophageal reflux disease without esophagitis: Secondary | ICD-10-CM | POA: Diagnosis not present

## 2023-05-30 DIAGNOSIS — R0789 Other chest pain: Secondary | ICD-10-CM | POA: Diagnosis not present

## 2023-06-02 DIAGNOSIS — H04123 Dry eye syndrome of bilateral lacrimal glands: Secondary | ICD-10-CM | POA: Diagnosis not present

## 2023-06-02 DIAGNOSIS — Z0100 Encounter for examination of eyes and vision without abnormal findings: Secondary | ICD-10-CM | POA: Diagnosis not present

## 2023-06-02 DIAGNOSIS — F413 Other mixed anxiety disorders: Secondary | ICD-10-CM | POA: Diagnosis not present

## 2023-06-02 DIAGNOSIS — Z961 Presence of intraocular lens: Secondary | ICD-10-CM | POA: Diagnosis not present

## 2023-06-02 DIAGNOSIS — H35373 Puckering of macula, bilateral: Secondary | ICD-10-CM | POA: Diagnosis not present

## 2023-06-16 DIAGNOSIS — F413 Other mixed anxiety disorders: Secondary | ICD-10-CM | POA: Diagnosis not present

## 2023-06-22 DIAGNOSIS — H18522 Epithelial (juvenile) corneal dystrophy, left eye: Secondary | ICD-10-CM | POA: Diagnosis not present

## 2023-06-22 DIAGNOSIS — H18523 Epithelial (juvenile) corneal dystrophy, bilateral: Secondary | ICD-10-CM | POA: Diagnosis not present

## 2023-06-22 DIAGNOSIS — H04123 Dry eye syndrome of bilateral lacrimal glands: Secondary | ICD-10-CM | POA: Diagnosis not present

## 2023-06-22 DIAGNOSIS — H18521 Epithelial (juvenile) corneal dystrophy, right eye: Secondary | ICD-10-CM | POA: Diagnosis not present

## 2023-06-27 DIAGNOSIS — K219 Gastro-esophageal reflux disease without esophagitis: Secondary | ICD-10-CM | POA: Diagnosis not present

## 2023-06-27 DIAGNOSIS — R5383 Other fatigue: Secondary | ICD-10-CM | POA: Diagnosis not present

## 2023-06-27 DIAGNOSIS — R053 Chronic cough: Secondary | ICD-10-CM | POA: Diagnosis not present

## 2023-06-27 DIAGNOSIS — J301 Allergic rhinitis due to pollen: Secondary | ICD-10-CM | POA: Diagnosis not present

## 2023-06-27 DIAGNOSIS — R5381 Other malaise: Secondary | ICD-10-CM | POA: Diagnosis not present

## 2023-06-27 DIAGNOSIS — Z87898 Personal history of other specified conditions: Secondary | ICD-10-CM | POA: Diagnosis not present

## 2023-06-27 DIAGNOSIS — R059 Cough, unspecified: Secondary | ICD-10-CM | POA: Diagnosis not present

## 2023-07-07 DIAGNOSIS — F413 Other mixed anxiety disorders: Secondary | ICD-10-CM | POA: Diagnosis not present

## 2023-07-26 DIAGNOSIS — H018 Other specified inflammations of eyelid: Secondary | ICD-10-CM | POA: Diagnosis not present

## 2023-07-26 DIAGNOSIS — H04123 Dry eye syndrome of bilateral lacrimal glands: Secondary | ICD-10-CM | POA: Diagnosis not present

## 2023-07-26 DIAGNOSIS — H16223 Keratoconjunctivitis sicca, not specified as Sjogren's, bilateral: Secondary | ICD-10-CM | POA: Diagnosis not present

## 2023-07-26 DIAGNOSIS — H35363 Drusen (degenerative) of macula, bilateral: Secondary | ICD-10-CM | POA: Diagnosis not present

## 2023-07-26 DIAGNOSIS — H35373 Puckering of macula, bilateral: Secondary | ICD-10-CM | POA: Diagnosis not present

## 2023-08-03 DIAGNOSIS — D2262 Melanocytic nevi of left upper limb, including shoulder: Secondary | ICD-10-CM | POA: Diagnosis not present

## 2023-08-03 DIAGNOSIS — D225 Melanocytic nevi of trunk: Secondary | ICD-10-CM | POA: Diagnosis not present

## 2023-08-03 DIAGNOSIS — L814 Other melanin hyperpigmentation: Secondary | ICD-10-CM | POA: Diagnosis not present

## 2023-08-03 DIAGNOSIS — Z08 Encounter for follow-up examination after completed treatment for malignant neoplasm: Secondary | ICD-10-CM | POA: Diagnosis not present

## 2023-08-03 DIAGNOSIS — Z85828 Personal history of other malignant neoplasm of skin: Secondary | ICD-10-CM | POA: Diagnosis not present

## 2023-08-03 DIAGNOSIS — D2261 Melanocytic nevi of right upper limb, including shoulder: Secondary | ICD-10-CM | POA: Diagnosis not present

## 2023-08-03 DIAGNOSIS — D2272 Melanocytic nevi of left lower limb, including hip: Secondary | ICD-10-CM | POA: Diagnosis not present

## 2023-08-03 DIAGNOSIS — L821 Other seborrheic keratosis: Secondary | ICD-10-CM | POA: Diagnosis not present

## 2023-08-03 DIAGNOSIS — D2271 Melanocytic nevi of right lower limb, including hip: Secondary | ICD-10-CM | POA: Diagnosis not present

## 2023-08-25 DIAGNOSIS — F413 Other mixed anxiety disorders: Secondary | ICD-10-CM | POA: Diagnosis not present

## 2023-09-02 DIAGNOSIS — Z23 Encounter for immunization: Secondary | ICD-10-CM | POA: Diagnosis not present

## 2023-09-05 DIAGNOSIS — F413 Other mixed anxiety disorders: Secondary | ICD-10-CM | POA: Diagnosis not present

## 2023-09-16 DIAGNOSIS — Z95828 Presence of other vascular implants and grafts: Secondary | ICD-10-CM | POA: Diagnosis not present

## 2023-09-16 DIAGNOSIS — I7143 Infrarenal abdominal aortic aneurysm, without rupture: Secondary | ICD-10-CM | POA: Diagnosis not present

## 2023-10-13 DIAGNOSIS — F413 Other mixed anxiety disorders: Secondary | ICD-10-CM | POA: Diagnosis not present

## 2023-11-11 DIAGNOSIS — F413 Other mixed anxiety disorders: Secondary | ICD-10-CM | POA: Diagnosis not present

## 2023-11-14 DIAGNOSIS — H16223 Keratoconjunctivitis sicca, not specified as Sjogren's, bilateral: Secondary | ICD-10-CM | POA: Diagnosis not present

## 2023-11-14 DIAGNOSIS — H018 Other specified inflammations of eyelid: Secondary | ICD-10-CM | POA: Diagnosis not present

## 2023-11-28 DIAGNOSIS — F413 Other mixed anxiety disorders: Secondary | ICD-10-CM | POA: Diagnosis not present

## 2023-12-06 DIAGNOSIS — Z87891 Personal history of nicotine dependence: Secondary | ICD-10-CM | POA: Diagnosis not present

## 2023-12-06 DIAGNOSIS — I7143 Infrarenal abdominal aortic aneurysm, without rupture: Secondary | ICD-10-CM | POA: Diagnosis not present

## 2023-12-06 DIAGNOSIS — I10 Essential (primary) hypertension: Secondary | ICD-10-CM | POA: Diagnosis not present

## 2023-12-06 DIAGNOSIS — E785 Hyperlipidemia, unspecified: Secondary | ICD-10-CM | POA: Diagnosis not present

## 2023-12-06 DIAGNOSIS — R0602 Shortness of breath: Secondary | ICD-10-CM | POA: Diagnosis not present

## 2023-12-06 DIAGNOSIS — Z8673 Personal history of transient ischemic attack (TIA), and cerebral infarction without residual deficits: Secondary | ICD-10-CM | POA: Diagnosis not present

## 2023-12-06 DIAGNOSIS — K219 Gastro-esophageal reflux disease without esophagitis: Secondary | ICD-10-CM | POA: Diagnosis not present

## 2023-12-06 DIAGNOSIS — R0789 Other chest pain: Secondary | ICD-10-CM | POA: Diagnosis not present

## 2023-12-14 DIAGNOSIS — L239 Allergic contact dermatitis, unspecified cause: Secondary | ICD-10-CM | POA: Diagnosis not present

## 2023-12-14 DIAGNOSIS — D485 Neoplasm of uncertain behavior of skin: Secondary | ICD-10-CM | POA: Diagnosis not present

## 2023-12-14 DIAGNOSIS — D0439 Carcinoma in situ of skin of other parts of face: Secondary | ICD-10-CM | POA: Diagnosis not present

## 2023-12-30 ENCOUNTER — Other Ambulatory Visit: Payer: Self-pay | Admitting: Family Medicine

## 2023-12-30 DIAGNOSIS — Z1231 Encounter for screening mammogram for malignant neoplasm of breast: Secondary | ICD-10-CM

## 2024-01-02 DIAGNOSIS — J449 Chronic obstructive pulmonary disease, unspecified: Secondary | ICD-10-CM | POA: Diagnosis not present

## 2024-01-05 DIAGNOSIS — F413 Other mixed anxiety disorders: Secondary | ICD-10-CM | POA: Diagnosis not present

## 2024-01-06 ENCOUNTER — Ambulatory Visit
Admission: RE | Admit: 2024-01-06 | Discharge: 2024-01-06 | Disposition: A | Discharge status: Home / Self Care | Source: Ambulatory Visit | Attending: Family Medicine | Admitting: Family Medicine

## 2024-01-06 DIAGNOSIS — Z1231 Encounter for screening mammogram for malignant neoplasm of breast: Secondary | ICD-10-CM | POA: Insufficient documentation

## 2024-02-08 DIAGNOSIS — I10 Essential (primary) hypertension: Secondary | ICD-10-CM | POA: Diagnosis not present

## 2024-02-08 DIAGNOSIS — Z79899 Other long term (current) drug therapy: Secondary | ICD-10-CM | POA: Diagnosis not present

## 2024-02-08 DIAGNOSIS — K219 Gastro-esophageal reflux disease without esophagitis: Secondary | ICD-10-CM | POA: Diagnosis not present

## 2024-02-08 DIAGNOSIS — E785 Hyperlipidemia, unspecified: Secondary | ICD-10-CM | POA: Diagnosis not present

## 2024-02-15 DIAGNOSIS — J449 Chronic obstructive pulmonary disease, unspecified: Secondary | ICD-10-CM | POA: Diagnosis not present

## 2024-02-15 DIAGNOSIS — Z0000 Encounter for general adult medical examination without abnormal findings: Secondary | ICD-10-CM | POA: Diagnosis not present

## 2024-02-15 DIAGNOSIS — Z1331 Encounter for screening for depression: Secondary | ICD-10-CM | POA: Diagnosis not present

## 2024-02-15 DIAGNOSIS — F17210 Nicotine dependence, cigarettes, uncomplicated: Secondary | ICD-10-CM | POA: Diagnosis not present

## 2024-02-29 DIAGNOSIS — L720 Epidermal cyst: Secondary | ICD-10-CM | POA: Diagnosis not present

## 2024-02-29 DIAGNOSIS — D0439 Carcinoma in situ of skin of other parts of face: Secondary | ICD-10-CM | POA: Diagnosis not present

## 2024-03-01 DIAGNOSIS — F413 Other mixed anxiety disorders: Secondary | ICD-10-CM | POA: Diagnosis not present

## 2024-03-12 DIAGNOSIS — F413 Other mixed anxiety disorders: Secondary | ICD-10-CM | POA: Diagnosis not present

## 2024-03-22 DIAGNOSIS — W19XXXD Unspecified fall, subsequent encounter: Secondary | ICD-10-CM | POA: Diagnosis not present

## 2024-03-22 DIAGNOSIS — R413 Other amnesia: Secondary | ICD-10-CM | POA: Diagnosis not present

## 2024-04-16 DIAGNOSIS — F413 Other mixed anxiety disorders: Secondary | ICD-10-CM | POA: Diagnosis not present

## 2024-05-14 DIAGNOSIS — F413 Other mixed anxiety disorders: Secondary | ICD-10-CM | POA: Diagnosis not present

## 2024-05-21 DIAGNOSIS — H16223 Keratoconjunctivitis sicca, not specified as Sjogren's, bilateral: Secondary | ICD-10-CM | POA: Diagnosis not present

## 2024-05-21 DIAGNOSIS — H018 Other specified inflammations of eyelid: Secondary | ICD-10-CM | POA: Diagnosis not present

## 2024-05-29 DIAGNOSIS — R413 Other amnesia: Secondary | ICD-10-CM | POA: Diagnosis not present

## 2024-05-29 DIAGNOSIS — R2689 Other abnormalities of gait and mobility: Secondary | ICD-10-CM | POA: Diagnosis not present

## 2024-05-29 DIAGNOSIS — Z7689 Persons encountering health services in other specified circumstances: Secondary | ICD-10-CM | POA: Diagnosis not present

## 2024-05-29 DIAGNOSIS — W19XXXS Unspecified fall, sequela: Secondary | ICD-10-CM | POA: Diagnosis not present

## 2024-05-29 DIAGNOSIS — R278 Other lack of coordination: Secondary | ICD-10-CM | POA: Diagnosis not present

## 2024-06-04 DIAGNOSIS — Z961 Presence of intraocular lens: Secondary | ICD-10-CM | POA: Diagnosis not present

## 2024-06-04 DIAGNOSIS — H43823 Vitreomacular adhesion, bilateral: Secondary | ICD-10-CM | POA: Diagnosis not present

## 2024-06-04 DIAGNOSIS — H04123 Dry eye syndrome of bilateral lacrimal glands: Secondary | ICD-10-CM | POA: Diagnosis not present

## 2024-06-04 DIAGNOSIS — Z0100 Encounter for examination of eyes and vision without abnormal findings: Secondary | ICD-10-CM | POA: Diagnosis not present

## 2024-06-04 DIAGNOSIS — H43813 Vitreous degeneration, bilateral: Secondary | ICD-10-CM | POA: Diagnosis not present

## 2024-07-02 DIAGNOSIS — F413 Other mixed anxiety disorders: Secondary | ICD-10-CM | POA: Diagnosis not present

## 2024-07-04 DIAGNOSIS — J208 Acute bronchitis due to other specified organisms: Secondary | ICD-10-CM | POA: Diagnosis not present

## 2024-07-09 DIAGNOSIS — F413 Other mixed anxiety disorders: Secondary | ICD-10-CM | POA: Diagnosis not present

## 2024-07-19 DIAGNOSIS — R4189 Other symptoms and signs involving cognitive functions and awareness: Secondary | ICD-10-CM | POA: Diagnosis not present

## 2024-07-19 DIAGNOSIS — F32A Depression, unspecified: Secondary | ICD-10-CM | POA: Diagnosis not present

## 2024-07-26 DIAGNOSIS — F413 Other mixed anxiety disorders: Secondary | ICD-10-CM | POA: Diagnosis not present

## 2024-08-03 DIAGNOSIS — F413 Other mixed anxiety disorders: Secondary | ICD-10-CM | POA: Diagnosis not present

## 2024-08-08 DIAGNOSIS — L448 Other specified papulosquamous disorders: Secondary | ICD-10-CM | POA: Diagnosis not present

## 2024-08-08 DIAGNOSIS — Z85828 Personal history of other malignant neoplasm of skin: Secondary | ICD-10-CM | POA: Diagnosis not present

## 2024-08-08 DIAGNOSIS — D2262 Melanocytic nevi of left upper limb, including shoulder: Secondary | ICD-10-CM | POA: Diagnosis not present

## 2024-08-08 DIAGNOSIS — L821 Other seborrheic keratosis: Secondary | ICD-10-CM | POA: Diagnosis not present

## 2024-08-08 DIAGNOSIS — D2261 Melanocytic nevi of right upper limb, including shoulder: Secondary | ICD-10-CM | POA: Diagnosis not present

## 2024-08-08 DIAGNOSIS — L570 Actinic keratosis: Secondary | ICD-10-CM | POA: Diagnosis not present

## 2024-08-10 DIAGNOSIS — F413 Other mixed anxiety disorders: Secondary | ICD-10-CM | POA: Diagnosis not present

## 2024-08-21 DIAGNOSIS — R4189 Other symptoms and signs involving cognitive functions and awareness: Secondary | ICD-10-CM | POA: Diagnosis not present

## 2024-08-21 DIAGNOSIS — I10 Essential (primary) hypertension: Secondary | ICD-10-CM | POA: Diagnosis not present

## 2024-08-21 DIAGNOSIS — F32A Depression, unspecified: Secondary | ICD-10-CM | POA: Diagnosis not present
